# Patient Record
Sex: Male | Born: 1955
Health system: Southern US, Community
[De-identification: ages and names within clinical notes are randomized; demographics above are authoritative.]

## PROBLEM LIST (undated history)

## (undated) DIAGNOSIS — K8020 Calculus of gallbladder without cholecystitis without obstruction: Secondary | ICD-10-CM

## (undated) DIAGNOSIS — G8929 Other chronic pain: Secondary | ICD-10-CM

## (undated) DIAGNOSIS — I10 Essential (primary) hypertension: Secondary | ICD-10-CM

## (undated) DIAGNOSIS — M549 Dorsalgia, unspecified: Secondary | ICD-10-CM

## (undated) HISTORY — DX: Essential (primary) hypertension: I10

## (undated) HISTORY — PX: CHOLECYSTECTOMY: SHX55

---

## 2003-03-10 ENCOUNTER — Encounter: Payer: Self-pay | Admitting: Emergency Medicine

## 2003-03-10 ENCOUNTER — Emergency Department (HOSPITAL_COMMUNITY): Admission: EM | Admit: 2003-03-10 | Discharge: 2003-03-10 | Payer: Self-pay | Admitting: Emergency Medicine

## 2008-03-05 ENCOUNTER — Emergency Department (HOSPITAL_COMMUNITY): Admission: EM | Admit: 2008-03-05 | Discharge: 2008-03-06 | Payer: Self-pay | Admitting: Emergency Medicine

## 2010-03-24 ENCOUNTER — Emergency Department (HOSPITAL_COMMUNITY): Admission: EM | Admit: 2010-03-24 | Discharge: 2010-03-24 | Payer: Self-pay | Admitting: Emergency Medicine

## 2010-03-30 ENCOUNTER — Emergency Department (HOSPITAL_COMMUNITY): Admission: EM | Admit: 2010-03-30 | Discharge: 2010-03-30 | Payer: Self-pay | Admitting: Emergency Medicine
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
## 2012-10-22 ENCOUNTER — Emergency Department (HOSPITAL_COMMUNITY)
Admission: EM | Admit: 2012-10-22 | Discharge: 2012-10-22 | Disposition: A | Discharge status: Home / Self Care | Payer: Self-pay | Attending: Emergency Medicine | Admitting: Emergency Medicine

## 2012-10-22 ENCOUNTER — Encounter (HOSPITAL_COMMUNITY): Payer: Self-pay | Admitting: *Deleted

## 2012-10-22 DIAGNOSIS — M549 Dorsalgia, unspecified: Secondary | ICD-10-CM | POA: Insufficient documentation

## 2012-10-22 DIAGNOSIS — G8929 Other chronic pain: Secondary | ICD-10-CM | POA: Insufficient documentation

## 2012-10-22 DIAGNOSIS — R1013 Epigastric pain: Secondary | ICD-10-CM | POA: Insufficient documentation

## 2012-10-22 DIAGNOSIS — F17200 Nicotine dependence, unspecified, uncomplicated: Secondary | ICD-10-CM | POA: Insufficient documentation

## 2012-10-22 HISTORY — DX: Dorsalgia, unspecified: M54.9

## 2012-10-22 HISTORY — DX: Other chronic pain: G89.29

## 2012-10-22 LAB — COMPREHENSIVE METABOLIC PANEL
Albumin: 4 g/dL (ref 3.5–5.2)
Alkaline Phosphatase: 54 U/L (ref 39–117)
BUN: 20 mg/dL (ref 6–23)
CO2: 29 mEq/L (ref 19–32)
Chloride: 101 mEq/L (ref 96–112)
Creatinine, Ser: 1.13 mg/dL (ref 0.50–1.35)
GFR calc Af Amer: 83 mL/min — ABNORMAL LOW (ref >90)
GFR calc non Af Amer: 71 mL/min — ABNORMAL LOW (ref >90)
Glucose, Bld: 121 mg/dL — ABNORMAL HIGH (ref 70–99)
Total Bilirubin: 0.5 mg/dL (ref 0.3–1.2)

## 2012-10-22 LAB — HEPATIC FUNCTION PANEL
Alkaline Phosphatase: 50 U/L (ref 39–117)
Bilirubin, Direct: 0.2 mg/dL (ref 0.0–0.3)
Indirect Bilirubin: 0.3 mg/dL (ref 0.3–0.9)
Total Bilirubin: 0.5 mg/dL (ref 0.3–1.2)

## 2012-10-22 LAB — CBC WITH DIFFERENTIAL/PLATELET
Basophils Relative: 0 % (ref 0–1)
HCT: 40.6 % (ref 39.0–52.0)
Hemoglobin: 13.9 g/dL (ref 13.0–17.0)
Lymphs Abs: 1.2 10*3/uL (ref 0.7–4.0)
MCHC: 34.2 g/dL (ref 30.0–36.0)
Monocytes Absolute: 0.3 10*3/uL (ref 0.1–1.0)
Monocytes Relative: 5 % (ref 3–12)
Neutro Abs: 5.1 10*3/uL (ref 1.7–7.7)
RBC: 5.12 MIL/uL (ref 4.22–5.81)

## 2012-10-22 LAB — LIPASE, BLOOD: Lipase: 26 U/L (ref 11–59)

## 2012-10-22 LAB — POCT I-STAT TROPONIN I: Troponin i, poc: 0.02 ng/mL (ref 0.00–0.08)

## 2012-10-22 MED ORDER — HYDROCODONE-ACETAMINOPHEN 5-325 MG PO TABS: 1.0000 | ORAL_TABLET | ORAL | Status: DC | PRN

## 2012-10-22 MED ORDER — GI COCKTAIL ~~LOC~~
30.0000 mL | Freq: Once | ORAL | Status: AC
  Administered 2012-10-22: 30 mL via ORAL
  Filled 2012-10-22: qty 30

## 2012-10-22 MED ORDER — OMEPRAZOLE 20 MG PO CPDR: 20.0000 mg | DELAYED_RELEASE_CAPSULE | Freq: Every day | ORAL | Status: DC

## 2012-10-22 NOTE — ED Notes (Signed)
NAD noted at time of d/c home 

## 2012-10-22 NOTE — ED Notes (Signed)
Pt experienced acute onset epigstric pain about 2 hours after eating breakfast.  Denies hx of GERD.  Denies urinary or bowel s/s.  C/o weakness, some nausea and some sob.

## 2012-10-22 NOTE — ED Notes (Signed)
Pt reports drinking daily and last drink was a 40oz beer. Pt c/o indigestion after greasy types of food.

## 2012-10-22 NOTE — ED Provider Notes (Signed)
History     CSN: [id number]  Arrival date & time 10/22/12  1122   First MD Initiated Contact with Patient 10/22/12 1150      Chief Complaint  Patient presents with  . Abdominal Pain    (Consider location/radiation/quality/duration/timing/severity/associated sxs/prior treatment) Patient is a 57 y.o. male presenting with abdominal pain. The history is provided by the patient.  Abdominal Pain Pain location:  Epigastric Pain quality: sharp   Pain radiates to:  Back Pain severity:  Moderate Onset quality:  Gradual Duration:  1 day Context comment:  He denies previous or repeated history of similar symptoms. He reports one beer last night and denies regular, daily alcohol use. Associated symptoms: no chest pain, no dysuria, no fever, no nausea, no shortness of breath and no vomiting   Associated symptoms comment:  He presents for evaluation of epigastric abdominal pain since this morning around 10:30. He has continued to have a normal appetite today. No fever, nausea or vomiting. No history of abdominal surgeries, known gastric or peptic ulcers. He denies cough or chest pain.   Past Medical History  Diagnosis Date  . Chronic back pain     History reviewed. No pertinent past surgical history.  No family history on file.  History  Substance Use Topics  . Smoking status: Current Every Day Smoker -- 1.00 packs/day    Types: Cigarettes  . Smokeless tobacco: Not on file  . Alcohol Use: No      Review of Systems  Constitutional: Negative for fever.  Respiratory: Negative for shortness of breath.   Cardiovascular: Negative for chest pain.  Gastrointestinal: Positive for abdominal pain. Negative for nausea and vomiting.  Genitourinary: Negative for dysuria.  Musculoskeletal: Negative for myalgias.  Neurological: Negative for light-headedness.  Psychiatric/Behavioral: Negative for confusion.    Allergies  Review of patient's allergies indicates no known allergies.  Home  Medications  No current outpatient prescriptions on file.  BP 120/52  Pulse 60  Temp(Src) 94.4 F (34.7 C) (Oral)  Resp 18  Ht 5' 9.5" (1.765 m)  Wt 185 lb (83.915 kg)  BMI 26.94 kg/m2  SpO2 98%  Physical Exam  Constitutional: He is oriented to person, place, and time. He appears well-developed and well-nourished.  HENT:  Head: Normocephalic.  Neck: Normal range of motion. Neck supple.  Cardiovascular: Normal rate and regular rhythm.   Pulmonary/Chest: Effort normal and breath sounds normal.  Abdominal: Soft. Bowel sounds are normal. There is no rebound and no guarding.  Eigastric tenderness to palpation. No guarding. No RUQ or LUQ tenderness.   Musculoskeletal: Normal range of motion.  Neurological: He is alert and oriented to person, place, and time.  Skin: Skin is warm and dry. No rash noted.  Psychiatric: He has a normal mood and affect.    ED Course  Procedures (including critical care time)  Labs Reviewed  COMPREHENSIVE METABOLIC PANEL - Abnormal; Notable for the following:    Glucose, Bld 121 (*)    GFR calc non Af Amer 71 (*)    GFR calc Af Amer 83 (*)    All other components within normal limits  CBC WITH DIFFERENTIAL  LIPASE, BLOOD  HEPATIC FUNCTION PANEL  POCT I-STAT TROPONIN I   Results for orders placed during the hospital encounter of 10/22/12  CBC WITH DIFFERENTIAL      Result Value Range   WBC 6.6  4.0 - 10.5 K/uL   RBC 5.12  4.22 - 5.81 MIL/uL   Hemoglobin 13.9  13.0 -  17.0 g/dL   HCT 16.1  09.6 - 04.5 %   MCV 79.3  78.0 - 100.0 fL   MCH 27.1  26.0 - 34.0 pg   MCHC 34.2  30.0 - 36.0 g/dL   RDW 40.9  81.1 - 91.4 %   Platelets 244  150 - 400 K/uL   Neutrophils Relative 76  43 - 77 %   Neutro Abs 5.1  1.7 - 7.7 K/uL   Lymphocytes Relative 18  12 - 46 %   Lymphs Abs 1.2  0.7 - 4.0 K/uL   Monocytes Relative 5  3 - 12 %   Monocytes Absolute 0.3  0.1 - 1.0 K/uL   Eosinophils Relative 1  0 - 5 %   Eosinophils Absolute 0.1  0.0 - 0.7 K/uL    Basophils Relative 0  0 - 1 %   Basophils Absolute 0.0  0.0 - 0.1 K/uL  COMPREHENSIVE METABOLIC PANEL      Result Value Range   Sodium 138  135 - 145 mEq/L   Potassium 4.0  3.5 - 5.1 mEq/L   Chloride 101  96 - 112 mEq/L   CO2 29  19 - 32 mEq/L   Glucose, Bld 121 (*) 70 - 99 mg/dL   BUN 20  6 - 23 mg/dL   Creatinine, Ser 7.82  0.50 - 1.35 mg/dL   Calcium 9.8  8.4 - 95.6 mg/dL   Total Protein 7.7  6.0 - 8.3 g/dL   Albumin 4.0  3.5 - 5.2 g/dL   AST 27  0 - 37 U/L   ALT 13  0 - 53 U/L   Alkaline Phosphatase 54  39 - 117 U/L   Total Bilirubin 0.5  0.3 - 1.2 mg/dL   GFR calc non Af Amer 71 (*) >90 mL/min   GFR calc Af Amer 83 (*) >90 mL/min  LIPASE, BLOOD      Result Value Range   Lipase 26  11 - 59 U/L  HEPATIC FUNCTION PANEL      Result Value Range   Total Protein 7.7  6.0 - 8.3 g/dL   Albumin 3.9  3.5 - 5.2 g/dL   AST 27  0 - 37 U/L   ALT 13  0 - 53 U/L   Alkaline Phosphatase 50  39 - 117 U/L   Total Bilirubin 0.5  0.3 - 1.2 mg/dL   Bilirubin, Direct 0.2  0.0 - 0.3 mg/dL   Indirect Bilirubin 0.3  0.3 - 0.9 mg/dL  POCT I-STAT TROPONIN I      Result Value Range   Troponin i, poc 0.02  0.00 - 0.08 ng/mL   Comment 3            Date: 10/22/2012  Rate: 53  Rhythm: normal sinus rhythm  QRS Axis: normal  Intervals: normal  ST/T Wave abnormalities: normal  Conduction Disutrbances:none  Narrative Interpretation:   Old EKG Reviewed: none available    No results found.   No diagnosis found. 1. Epigastric pain    MDM  Re-evaluation:  Pain is unchanged after GI cocktail. He remains mildly tender to palpation of epigastrium. Blood studies are normal. Vital signs stable - initially hypothermic for now normal temp on recheck. Feel he is stable for discharge and GI follow up in the outpatient setting.        Arnoldo Hooker, PA-C 10/22/12 1458

## 2012-10-24 NOTE — ED Provider Notes (Signed)
Medical screening examination/treatment/procedure(s) were performed by non-physician practitioner and as supervising physician I was immediately available for consultation/collaboration.  Christoper Bushey T Tiesha Marich, MD 10/24/12 0821 

## 2012-12-25 ENCOUNTER — Ambulatory Visit (HOSPITAL_COMMUNITY)
Admission: RE | Admit: 2012-12-25 | Discharge: 2012-12-25 | Disposition: A | Discharge status: Home / Self Care | Payer: Self-pay | Source: Ambulatory Visit | Attending: Pediatrics | Admitting: Pediatrics

## 2012-12-25 ENCOUNTER — Other Ambulatory Visit (HOSPITAL_COMMUNITY): Payer: Self-pay | Admitting: *Deleted

## 2012-12-25 DIAGNOSIS — M545 Low back pain: Secondary | ICD-10-CM

## 2012-12-25 DIAGNOSIS — M79609 Pain in unspecified limb: Secondary | ICD-10-CM | POA: Insufficient documentation

## 2012-12-25 DIAGNOSIS — R209 Unspecified disturbances of skin sensation: Secondary | ICD-10-CM | POA: Insufficient documentation

## 2014-02-10 ENCOUNTER — Encounter (HOSPITAL_COMMUNITY): Payer: Self-pay | Admitting: Emergency Medicine

## 2014-02-10 ENCOUNTER — Emergency Department (HOSPITAL_COMMUNITY): Payer: Self-pay

## 2014-02-10 ENCOUNTER — Emergency Department (HOSPITAL_COMMUNITY)
Admission: EM | Admit: 2014-02-10 | Discharge: 2014-02-10 | Disposition: A | Discharge status: Home / Self Care | Payer: Self-pay | Attending: Emergency Medicine | Admitting: Emergency Medicine

## 2014-02-10 DIAGNOSIS — F17200 Nicotine dependence, unspecified, uncomplicated: Secondary | ICD-10-CM | POA: Insufficient documentation

## 2014-02-10 DIAGNOSIS — G8929 Other chronic pain: Secondary | ICD-10-CM | POA: Insufficient documentation

## 2014-02-10 DIAGNOSIS — R059 Cough, unspecified: Secondary | ICD-10-CM | POA: Insufficient documentation

## 2014-02-10 DIAGNOSIS — Z79899 Other long term (current) drug therapy: Secondary | ICD-10-CM | POA: Insufficient documentation

## 2014-02-10 DIAGNOSIS — R05 Cough: Secondary | ICD-10-CM

## 2014-02-10 MED ORDER — AZITHROMYCIN 250 MG PO TABS
250.0000 mg | ORAL_TABLET | Freq: Every day | ORAL | Status: DC
Start: 2014-02-10 — End: 2014-12-13

## 2014-02-10 MED ORDER — DEXTROMETHORPHAN POLISTIREX 30 MG/5ML PO LQCR: 30.0000 mg | ORAL | Status: DC | PRN

## 2014-02-10 NOTE — ED Provider Notes (Signed)
Medical screening examination/treatment/procedure(s) were performed by non-physician practitioner and as supervising physician I was immediately available for consultation/collaboration.   Jaice Lague David Osvaldo Lamping III, MD 02/10/14 2203 

## 2014-02-10 NOTE — ED Notes (Signed)
Case management at bedside.

## 2014-02-10 NOTE — Discharge Instructions (Signed)
Take Azithromycin as directed until gone. Take delsym as needed for cough. Refer to attached documents for more information.

## 2014-02-10 NOTE — Progress Notes (Signed)
  CARE MANAGEMENT ED NOTE 02/10/2014  Patient:  Ivan Guerrero,Ivan Guerrero   Account Number:  [id number]  Date Initiated:  02/10/2014  Documentation initiated by:  Ivan Guerrero,Ivan Guerrero  Subjective/Objective Assessment:   58 yo male presenting to the ED with a cough     Subjective/Objective Assessment Detail:     Action/Plan:   Patient is to follow up with PCP resources   Action/Plan Detail:   Anticipated DC Date:       Status Recommendation to Physician:   Result of Recommendation:  Agreed    DC Planning Services  CM consult  Other  PCP issues    Choice offered to / List presented to:  C-1 Patient          Status of service:  Completed, signed off  ED Comments:   ED Comments Detail:  CM consulted to assist with PCP. This CM spoke with the patient regarding his ED visit. The patient stated that he has had a persistent cough for several weeks. This CM provided the patient with a list of [facility] resources and the pamphlet for the [facility] and encouraged him to follow up. This CM provided the information to the [facility] liaison to follow up for the orange card.

## 2014-02-10 NOTE — ED Provider Notes (Signed)
CSN: [id number]     Arrival date & time 02/10/14  1144 History  This chart was scribed for Ivan BeckKaitlyn Deonta Bomberger, PA-C, working with Ivan RoofJohn David Wofford III, MD, by Ivan CarryG. Clay Guerrero, ED Scribe. The patient was seen in TR05C/TR05C. The patient's care was started at 1:24 PM.    Chief Complaint  Patient presents with  . Cough   HPI HPI Comments: Ivan Guerrero is a 58 y.o. male with a history of smoking who presents to the Emergency Department complaining of a productive cough beginning 5-6 months ago. Patient reports associated weight loss of an estimated 10-12 pounds. Patient reports that he moved to a new house in December 2014. He believes there may be mold in the house, and thinks this may be associated with his cough. Patient denies fever or hemoptysis.   Patient does not have a PCP.   Past Medical History  Diagnosis Date  . Chronic back pain    History reviewed. No pertinent past surgical history. History reviewed. No pertinent family history. History  Substance Use Topics  . Smoking status: Current Every Day Smoker -- 1.00 packs/day    Types: Cigarettes  . Smokeless tobacco: Not on file  . Alcohol Use: No    Review of Systems  Constitutional: Positive for unexpected weight change. Negative for fever.  Respiratory: Positive for cough.        Denies coughing up blood.  All other systems reviewed and are negative.     Allergies  Review of patient's allergies indicates no known allergies.  Home Medications   Prior to Admission medications   Medication Sig Start Date End Date Taking? Authorizing Provider  HYDROcodone-acetaminophen (NORCO/VICODIN) 5-325 MG per tablet Take 1 tablet by mouth every 4 (four) hours as needed for pain. 10/22/12   Shari A Upstill, PA-C  omeprazole (PRILOSEC) 20 MG capsule Take 1 capsule (20 mg total) by mouth daily. 10/22/12   Shari A Upstill, PA-C   Triage Vitals: BP 147/87  Pulse 80  Temp(Src) 98.4 F (36.9 C) (Oral)  Resp 18  SpO2 99% Physical Exam   Nursing note and vitals reviewed. Constitutional: He is oriented to person, place, and time. He appears well-developed and well-nourished. No distress.  HENT:  Head: Normocephalic and atraumatic.  Mouth/Throat: Oropharynx is clear and moist. No oropharyngeal exudate.  Eyes: Conjunctivae and EOM are normal.  Neck: Normal range of motion.  Cardiovascular: Normal rate and regular rhythm.  Exam reveals no gallop and no friction rub.   No murmur heard. Pulmonary/Chest: Effort normal and breath sounds normal. He has no wheezes. He has no rales. He exhibits no tenderness.  Musculoskeletal: Normal range of motion.  Lymphadenopathy:    He has no cervical adenopathy.  Neurological: He is alert and oriented to person, place, and time. Coordination normal.  Speech is goal-oriented. Moves limbs without ataxia.   Skin: Skin is warm and dry.  Psychiatric: He has a normal mood and affect. His behavior is normal.    ED Course  Procedures (including critical care time) DIAGNOSTIC STUDIES: Oxygen Saturation is 99% on room air, normal by my interpretation.    COORDINATION OF CARE: 1:30 PM-Discussed treatment plan which includes a CXR with pt at bedside and pt agreed to plan.     Labs Review Labs Reviewed - No data to display  Imaging Review Dg Chest 2 View  02/10/2014   CLINICAL DATA:  Cough  EXAM: CHEST  2 VIEW  COMPARISON:  None.  FINDINGS: Normal heart size. Hyperaeration. No  consolidation or mass. No pneumothorax. No pleural effusion.  IMPRESSION: No active cardiopulmonary disease.   Electronically Signed   By: Ivan Guerrero M.D.   On: 02/10/2014 13:11     EKG Interpretation None      MDM   Final diagnoses:  Cough    Patient's chest xray unremarkable for acute changes. Patient likely has bronchitis. He believes his symptoms are due to mold in his house. Patient referred to PCP and seen by case management. Patient will have azithromycin and delsym for symptoms.   I personally performed  the services described in this documentation, which was scribed in my presence. The recorded information has been reviewed and is accurate.    Ivan Beck, PA-C 02/10/14 1600

## 2014-02-10 NOTE — ED Notes (Signed)
Per pt sts he has had a cough for a while. sts he thinks its mold in his house.

## 2014-02-19 ENCOUNTER — Encounter: Payer: Self-pay | Admitting: Internal Medicine

## 2014-02-19 ENCOUNTER — Ambulatory Visit: Payer: Self-pay | Attending: Internal Medicine | Admitting: Internal Medicine

## 2014-02-19 VITALS — BP 148/89 | HR 55 | Temp 97.6°F | Resp 14 | Ht 69.5 in | Wt 152.0 lb

## 2014-02-19 DIAGNOSIS — R059 Cough, unspecified: Secondary | ICD-10-CM

## 2014-02-19 DIAGNOSIS — F17200 Nicotine dependence, unspecified, uncomplicated: Secondary | ICD-10-CM

## 2014-02-19 DIAGNOSIS — R0981 Nasal congestion: Secondary | ICD-10-CM

## 2014-02-19 DIAGNOSIS — Z139 Encounter for screening, unspecified: Secondary | ICD-10-CM

## 2014-02-19 DIAGNOSIS — H538 Other visual disturbances: Secondary | ICD-10-CM

## 2014-02-19 DIAGNOSIS — R030 Elevated blood-pressure reading, without diagnosis of hypertension: Secondary | ICD-10-CM

## 2014-02-19 DIAGNOSIS — IMO0001 Reserved for inherently not codable concepts without codable children: Secondary | ICD-10-CM

## 2014-02-19 DIAGNOSIS — R05 Cough: Secondary | ICD-10-CM

## 2014-02-19 DIAGNOSIS — Z1211 Encounter for screening for malignant neoplasm of colon: Secondary | ICD-10-CM

## 2014-02-19 DIAGNOSIS — J3489 Other specified disorders of nose and nasal sinuses: Secondary | ICD-10-CM

## 2014-02-19 LAB — LIPID PANEL
Cholesterol: 141 mg/dL (ref 0–200)
HDL: 50 mg/dL (ref >39)
LDL Cholesterol: 81 mg/dL (ref 0–99)
Total CHOL/HDL Ratio: 2.8 Ratio
Triglycerides: 48 mg/dL (ref <150)
VLDL: 10 mg/dL (ref 0–40)

## 2014-02-19 LAB — CBC WITH DIFFERENTIAL/PLATELET
BASOS PCT: 1 % (ref 0–1)
Basophils Absolute: 0 10*3/uL (ref 0.0–0.1)
EOS PCT: 2 % (ref 0–5)
Eosinophils Absolute: 0.1 10*3/uL (ref 0.0–0.7)
HEMATOCRIT: 41.9 % (ref 39.0–52.0)
HEMOGLOBIN: 13.7 g/dL (ref 13.0–17.0)
Lymphocytes Relative: 44 % (ref 12–46)
Lymphs Abs: 1.4 10*3/uL (ref 0.7–4.0)
MCH: 27.3 pg (ref 26.0–34.0)
MCHC: 32.7 g/dL (ref 30.0–36.0)
MCV: 83.5 fL (ref 78.0–100.0)
MONO ABS: 0.5 10*3/uL (ref 0.1–1.0)
MONOS PCT: 15 % — AB (ref 3–12)
NEUTROS ABS: 1.2 10*3/uL — AB (ref 1.7–7.7)
Neutrophils Relative %: 38 % — ABNORMAL LOW (ref 43–77)
Platelets: 285 10*3/uL (ref 150–400)
RBC: 5.02 MIL/uL (ref 4.22–5.81)
RDW: 15.9 % — ABNORMAL HIGH (ref 11.5–15.5)
WBC: 3.2 10*3/uL — ABNORMAL LOW (ref 4.0–10.5)

## 2014-02-19 LAB — COMPLETE METABOLIC PANEL WITH GFR
ALBUMIN: 4 g/dL (ref 3.5–5.2)
ALK PHOS: 39 U/L (ref 39–117)
ALT: <8 U/L (ref 0–53)
AST: 12 U/L (ref 0–37)
BUN: 16 mg/dL (ref 6–23)
CALCIUM: 9 mg/dL (ref 8.4–10.5)
CO2: 29 mEq/L (ref 19–32)
Chloride: 104 mEq/L (ref 96–112)
Creat: 1 mg/dL (ref 0.50–1.35)
GFR, EST NON AFRICAN AMERICAN: 84 mL/min
GFR, Est African American: >89 mL/min
GLUCOSE: 93 mg/dL (ref 70–99)
POTASSIUM: 5.1 meq/L (ref 3.5–5.3)
Sodium: 141 mEq/L (ref 135–145)
Total Bilirubin: 0.6 mg/dL (ref 0.2–1.2)
Total Protein: 6.8 g/dL (ref 6.0–8.3)

## 2014-02-19 LAB — TSH: TSH: 0.725 u[IU]/mL (ref 0.350–4.500)

## 2014-02-19 MED ORDER — BENZONATATE 100 MG PO CAPS: 100.0000 mg | ORAL_CAPSULE | Freq: Three times a day (TID) | ORAL | Status: DC | PRN

## 2014-02-19 MED ORDER — FLUTICASONE PROPIONATE 50 MCG/ACT NA SUSP: 2.0000 | Freq: Every day | NASAL | Status: AC

## 2014-02-19 NOTE — Progress Notes (Signed)
Patient Demographics  Ivan Hatchetony Guerrero, is a 58 y.o. male  XBM:[id number]:[id number]  [id number]  DOB - 08-07-1955  CC:  Chief Complaint  Patient presents with  . Establish Care  . Chemical Exposure    Possible mold exposure       HPI: Ivan Guerrero is a 58 y.o. male here today to establish medical care.Patient was recently seen in the ER and was treated for bronchitis, had a chest x-ray done which was negative for any infiltrates. Patient has completed a course of antibiotic. Patient does smoke cigarettes, I have advised patient to quit smoking has occasional cough does complain of nasal congestion postnasal drip, denies any fever chills, today's blood pressure is elevated, patient does report some history of hypertension. Patient has No headache, No chest pain, No abdominal pain - No Nausea, No new weakness tingling or numbness, No Cough - SOB.  No Known Allergies Past Medical History  Diagnosis Date  . Chronic back pain    Current Outpatient Prescriptions on File Prior to Visit  Medication Sig Dispense Refill  . azithromycin (ZITHROMAX Z-PAK) 250 MG tablet Take 1 tablet (250 mg total) by mouth daily. 500mg  PO day 1, then 250mg  PO days 205  6 tablet  0  . dextromethorphan (DELSYM) 30 MG/5ML liquid Take 5 mLs (30 mg total) by mouth as needed for cough.  89 mL  0   No current facility-administered medications on file prior to visit.   Family History  Problem Relation Age of Onset  . Hypertension Mother   . Diabetes Mother   . Hypertension Father   . Cancer Father   . Hypertension Sister   . Diabetes Brother    History   Social History  . Marital Status: Divorced    Spouse Name: N/A    Number of Children: N/A  . Years of Education: N/A   Occupational History  . Not on file.   Social History Main Topics  . Smoking status: Current Every Day Smoker -- 1.00 packs/day for 35 years    Types: Cigarettes  . Smokeless tobacco: Not on file  . Alcohol Use: No  . Drug Use: No    . Sexual Activity: Not on file   Other Topics Concern  . Not on file   Social History Narrative  . No narrative on file    Review of Systems: Constitutional: Negative for fever, chills, diaphoresis, activity change, appetite change and fatigue. HENT: Negative for ear pain, nosebleeds, congestion, facial swelling, rhinorrhea, neck pain, neck stiffness and ear discharge.  Eyes: Negative for pain, discharge, redness, itching and visual disturbance. Respiratory: Negative for cough, choking, chest tightness, shortness of breath, wheezing and stridor.  Cardiovascular: Negative for chest pain, palpitations and leg swelling. Gastrointestinal: Negative for abdominal distention. Genitourinary: Negative for dysuria, urgency, frequency, hematuria, flank pain, decreased urine volume, difficulty urinating and dyspareunia.  Musculoskeletal: Negative for back pain, joint swelling, arthralgia and gait problem. Neurological: Negative for dizziness, tremors, seizures, syncope, facial asymmetry, speech difficulty, weakness, light-headedness, numbness and headaches.  Hematological: Negative for adenopathy. Does not bruise/bleed easily. Psychiatric/Behavioral: Negative for hallucinations, behavioral problems, confusion, dysphoric mood, decreased concentration and agitation.    Objective:   Filed Vitals:   02/19/14 1127  BP: 148/89  Pulse: 55  Temp: 97.6 F (36.4 C)  Resp: 14    Physical Exam: Constitutional: Patient appears well-developed and well-nourished. No distress. HENT: Normocephalic, atraumatic, External right and left ear normal. Oropharynx is clear and moist. Nasal congestion no sinus  tenderness Eyes: Conjunctivae and EOM are normal. PERRLA, no scleral icterus. Neck: Normal ROM. Neck supple. No JVD. No tracheal deviation. No thyromegaly. CVS: RRR, S1/S2 +, no murmurs, no gallops, no carotid bruit.  Pulmonary: Effort and breath sounds normal, no stridor, rhonchi, wheezes, rales.   Abdominal: Soft. BS +, no distension, tenderness, rebound or guarding.  Musculoskeletal: Normal range of motion. No edema and no tenderness.  Neuro: Alert. Normal reflexes, muscle tone coordination. No cranial nerve deficit. Skin: Skin is warm and dry. No rash noted. Not diaphoretic. No erythema. No pallor. Psychiatric: Normal mood and affect. Behavior, judgment, thought content normal.  Lab Results  Component Value Date   WBC 6.6 10/22/2012   HGB 13.9 10/22/2012   HCT 40.6 10/22/2012   MCV 79.3 10/22/2012   PLT 244 10/22/2012   Lab Results  Component Value Date   CREATININE 1.13 10/22/2012   BUN 20 10/22/2012   NA 138 10/22/2012   K 4.0 10/22/2012   CL 101 10/22/2012   CO2 29 10/22/2012    No results found for this basename: HGBA1C   Lipid Panel  No results found for this basename: chol, trig, hdl, cholhdl, vldl, ldlcalc       Assessment and plan:   1. Elevated BP Advise patient for DASH diet  2. Smoking , Advise patient to quit smoking  3. Cough Prescribed Tessalon pearls when necessary for cough - benzonatate (TESSALON) 100 MG capsule; Take 1 capsule (100 mg total) by mouth 3 (three) times daily as needed for cough.  Dispense: 30 capsule; Refill: 1  4. Blurry vision  - Ambulatory referral to Ophthalmology  5. Screening Ordered baseline blood work - CBC with Differential - COMPLETE METABOLIC PANEL WITH GFR - TSH - Lipid panel - Vit D  25 hydroxy (rtn osteoporosis monitoring)  6. Special screening for malignant neoplasms, colon  - Ambulatory referral to Gastroenterology  7. Nasal congestion Trial of Flonase - fluticasone (FLONASE) 50 MCG/ACT nasal spray; Place 2 sprays into both nostrils daily.  Dispense: 16 g; Refill: 3      Health Maintenance -Colonoscopy: referred to GI   Return in about 3 months (around 05/22/2014).   Doris Cheadle, MD

## 2014-02-19 NOTE — Progress Notes (Signed)
Patient is here today to establish care.  His major concern is mold exposure.  The ceiling in his house fell in and had mold in it.  He would like tested today.

## 2014-02-19 NOTE — Patient Instructions (Signed)
DASH Eating Plan °DASH stands for "Dietary Approaches to Stop Hypertension." The DASH eating plan is a healthy eating plan that has been shown to reduce high blood pressure (hypertension). Additional health benefits may include reducing the risk of type 2 diabetes mellitus, heart disease, and stroke. The DASH eating plan may also help with weight loss. °WHAT DO I NEED TO KNOW ABOUT THE DASH EATING PLAN? °For the DASH eating plan, you will follow these general guidelines: °· Choose foods with a percent daily value for sodium of less than 5% (as listed on the food label). °· Use salt-free seasonings or herbs instead of table salt or sea salt. °· Check with your health care provider or pharmacist before using salt substitutes. °· Eat lower-sodium products, often labeled as "lower sodium" or "no salt added." °· Eat fresh foods. °· Eat more vegetables, fruits, and low-fat dairy products. °· Choose whole grains. Look for the word "whole" as the first word in the ingredient list. °· Choose fish and skinless chicken or turkey more often than red meat. Limit fish, poultry, and meat to 6 oz (170 g) each day. °· Limit sweets, desserts, sugars, and sugary drinks. °· Choose heart-healthy fats. °· Limit cheese to 1 oz (28 g) per day. °· Eat more home-cooked food and less restaurant, buffet, and fast food. °· Limit fried foods. °· Cook foods using methods other than frying. °· Limit canned vegetables. If you do use them, rinse them well to decrease the sodium. °· When eating at a restaurant, ask that your food be prepared with less salt, or no salt if possible. °WHAT FOODS CAN I EAT? °Seek help from a dietitian for individual calorie needs. °Grains °Whole grain or whole wheat bread. Brown rice. Whole grain or whole wheat pasta. Quinoa, bulgur, and whole grain cereals. Low-sodium cereals. Corn or whole wheat flour tortillas. Whole grain cornbread. Whole grain crackers. Low-sodium crackers. °Vegetables °Fresh or frozen vegetables  (raw, steamed, roasted, or grilled). Low-sodium or reduced-sodium tomato and vegetable juices. Low-sodium or reduced-sodium tomato sauce and paste. Low-sodium or reduced-sodium canned vegetables.  °Fruits °All fresh, canned (in natural juice), or frozen fruits. °Meat and Other Protein Products °Ground beef (85% or leaner), grass-fed beef, or beef trimmed of fat. Skinless chicken or turkey. Ground chicken or turkey. Pork trimmed of fat. All fish and seafood. Eggs. Dried beans, peas, or lentils. Unsalted nuts and seeds. Unsalted canned beans. °Dairy °Low-fat dairy products, such as skim or 1% milk, 2% or reduced-fat cheeses, low-fat ricotta or cottage cheese, or plain low-fat yogurt. Low-sodium or reduced-sodium cheeses. °Fats and Oils °Tub margarines without trans fats. Light or reduced-fat mayonnaise and salad dressings (reduced sodium). Avocado. Safflower, olive, or canola oils. Natural peanut or almond butter. °Other °Unsalted popcorn and pretzels. °The items listed above may not be a complete list of recommended foods or beverages. Contact your dietitian for more options. °WHAT FOODS ARE NOT RECOMMENDED? °Grains °White bread. White pasta. White rice. Refined cornbread. Bagels and croissants. Crackers that contain trans fat. °Vegetables °Creamed or fried vegetables. Vegetables in a cheese sauce. Regular canned vegetables. Regular canned tomato sauce and paste. Regular tomato and vegetable juices. °Fruits °Dried fruits. Canned fruit in light or heavy syrup. Fruit juice. °Meat and Other Protein Products °Fatty cuts of meat. Ribs, chicken wings, bacon, sausage, bologna, salami, chitterlings, fatback, hot dogs, bratwurst, and packaged luncheon meats. Salted nuts and seeds. Canned beans with salt. °Dairy °Whole or 2% milk, cream, half-and-half, and cream cheese. Whole-fat or sweetened yogurt. Full-fat   cheeses or blue cheese. Nondairy creamers and whipped toppings. Processed cheese, cheese spreads, or cheese  curds. °Condiments °Onion and garlic salt, seasoned salt, table salt, and sea salt. Canned and packaged gravies. Worcestershire sauce. Tartar sauce. Barbecue sauce. Teriyaki sauce. Soy sauce, including reduced sodium. Steak sauce. Fish sauce. Oyster sauce. Cocktail sauce. Horseradish. Ketchup and mustard. Meat flavorings and tenderizers. Bouillon cubes. Hot sauce. Tabasco sauce. Marinades. Taco seasonings. Relishes. °Fats and Oils °Butter, stick margarine, lard, shortening, ghee, and bacon fat. Coconut, palm kernel, or palm oils. Regular salad dressings. °Other °Pickles and olives. Salted popcorn and pretzels. °The items listed above may not be a complete list of foods and beverages to avoid. Contact your dietitian for more information. °WHERE CAN I FIND MORE INFORMATION? °[employer], [employer], [employer]: www.nhlbi.nih.gov/health/health-topics/topics/dash/ °Document Released: 06/14/2011 Document Revised: 11/09/2013 Document Reviewed: 04/29/2013 °Exit[employer]® Patient Information ©2015 [employer]. This information is not intended to replace advice given to you by your health care provider. Make sure you discuss any questions you have with your health care provider. ° °

## 2014-02-20 LAB — VITAMIN D 25 HYDROXY (VIT D DEFICIENCY, FRACTURES): VIT D 25 HYDROXY: 32 ng/mL (ref 30–89)

## 2014-03-19 ENCOUNTER — Ambulatory Visit: Payer: Self-pay | Attending: Internal Medicine

## 2014-09-12 ENCOUNTER — Encounter (HOSPITAL_COMMUNITY): Payer: Self-pay | Admitting: *Deleted

## 2014-09-12 ENCOUNTER — Emergency Department (HOSPITAL_COMMUNITY)
Admission: EM | Admit: 2014-09-12 | Discharge: 2014-09-12 | Disposition: A | Discharge status: Home / Self Care | Payer: Self-pay | Attending: Emergency Medicine | Admitting: Emergency Medicine

## 2014-09-12 DIAGNOSIS — Z720 Tobacco use: Secondary | ICD-10-CM | POA: Insufficient documentation

## 2014-09-12 DIAGNOSIS — R110 Nausea: Secondary | ICD-10-CM

## 2014-09-12 DIAGNOSIS — R1013 Epigastric pain: Secondary | ICD-10-CM | POA: Insufficient documentation

## 2014-09-12 DIAGNOSIS — R1010 Upper abdominal pain, unspecified: Secondary | ICD-10-CM

## 2014-09-12 DIAGNOSIS — R112 Nausea with vomiting, unspecified: Secondary | ICD-10-CM | POA: Insufficient documentation

## 2014-09-12 DIAGNOSIS — G8929 Other chronic pain: Secondary | ICD-10-CM | POA: Insufficient documentation

## 2014-09-12 LAB — COMPREHENSIVE METABOLIC PANEL
ALT: 13 U/L (ref 0–53)
AST: 18 U/L (ref 0–37)
Albumin: 3.5 g/dL (ref 3.5–5.2)
Alkaline Phosphatase: 53 U/L (ref 39–117)
Anion gap: 4 — ABNORMAL LOW (ref 5–15)
BUN: 13 mg/dL (ref 6–23)
CHLORIDE: 105 mmol/L (ref 96–112)
CO2: 33 mmol/L — AB (ref 19–32)
Calcium: 8.8 mg/dL (ref 8.4–10.5)
Creatinine, Ser: 1.12 mg/dL (ref 0.50–1.35)
GFR calc non Af Amer: 71 mL/min — ABNORMAL LOW (ref >90)
GFR, EST AFRICAN AMERICAN: 82 mL/min — AB (ref >90)
GLUCOSE: 79 mg/dL (ref 70–99)
POTASSIUM: 4.4 mmol/L (ref 3.5–5.1)
SODIUM: 142 mmol/L (ref 135–145)
Total Bilirubin: 0.7 mg/dL (ref 0.3–1.2)
Total Protein: 6.4 g/dL (ref 6.0–8.3)

## 2014-09-12 LAB — CBC WITH DIFFERENTIAL/PLATELET
BASOS PCT: 1 % (ref 0–1)
Basophils Absolute: 0 10*3/uL (ref 0.0–0.1)
EOS ABS: 0 10*3/uL (ref 0.0–0.7)
EOS PCT: 1 % (ref 0–5)
HCT: 40.8 % (ref 39.0–52.0)
HEMOGLOBIN: 13.4 g/dL (ref 13.0–17.0)
LYMPHS PCT: 42 % (ref 12–46)
Lymphs Abs: 1.5 10*3/uL (ref 0.7–4.0)
MCH: 27.7 pg (ref 26.0–34.0)
MCHC: 32.8 g/dL (ref 30.0–36.0)
MCV: 84.5 fL (ref 78.0–100.0)
MONO ABS: 0.3 10*3/uL (ref 0.1–1.0)
Monocytes Relative: 10 % (ref 3–12)
NEUTROS ABS: 1.6 10*3/uL — AB (ref 1.7–7.7)
Neutrophils Relative %: 46 % (ref 43–77)
PLATELETS: 237 10*3/uL (ref 150–400)
RBC: 4.83 MIL/uL (ref 4.22–5.81)
RDW: 15.5 % (ref 11.5–15.5)
WBC: 3.5 10*3/uL — ABNORMAL LOW (ref 4.0–10.5)

## 2014-09-12 LAB — LIPASE, BLOOD: Lipase: 25 U/L (ref 11–59)

## 2014-09-12 MED ORDER — PROMETHAZINE HCL 25 MG PO TABS: 25.0000 mg | ORAL_TABLET | Freq: Four times a day (QID) | ORAL | Status: DC | PRN

## 2014-09-12 MED ORDER — ONDANSETRON HCL 4 MG/2ML IJ SOLN
4.0000 mg | Freq: Once | INTRAMUSCULAR | Status: AC
  Administered 2014-09-12: 4 mg via INTRAVENOUS
  Filled 2014-09-12: qty 2

## 2014-09-12 MED ORDER — MORPHINE SULFATE 4 MG/ML IJ SOLN
4.0000 mg | Freq: Once | INTRAMUSCULAR | Status: DC
  Filled 2014-09-12: qty 1

## 2014-09-12 MED ORDER — SODIUM CHLORIDE 0.9 % IV BOLUS (SEPSIS)
1000.0000 mL | Freq: Once | INTRAVENOUS | Status: AC
  Administered 2014-09-12: 1000 mL via INTRAVENOUS

## 2014-09-12 MED ORDER — KETOROLAC TROMETHAMINE 30 MG/ML IJ SOLN
30.0000 mg | Freq: Once | INTRAMUSCULAR | Status: AC
  Administered 2014-09-12: 30 mg via INTRAVENOUS
  Filled 2014-09-12: qty 1

## 2014-09-12 MED ORDER — HYDROCODONE-ACETAMINOPHEN 5-325 MG PO TABS: 2.0000 | ORAL_TABLET | ORAL | Status: DC | PRN

## 2014-09-12 NOTE — ED Notes (Signed)
Pt reports having left side abd pain that started yesterday, radiates around to his back. Pt having vomiting, denies diarrhea or urinary symptoms.

## 2014-09-12 NOTE — ED Provider Notes (Signed)
CSN: [id number]     Arrival date & time 09/12/14  1202 History   First MD Initiated Contact with Patient 09/12/14 1334     Chief Complaint  Patient presents with  . Abdominal Pain     (Consider location/radiation/quality/duration/timing/severity/associated sxs/prior Treatment) HPI Comments: Patient is a 59 year old male with a past medical history of chronic back pain who presents with abdominal pain that started yesterday. The pain is located in the upper abdomen and radiates to his back . The pain is described as aching and severe. The pain started gradually and progressively worsened since the onset. No alleviating/aggravating factors. The patient has tried nothing for symptoms without relief. Associated symptoms include nausea and vomiting. Patient denies fever, headache, diarrhea, chest pain, SOB, dysuria, constipation. No history of abdominal surgery.    Past Medical History  Diagnosis Date  . Chronic back pain    History reviewed. No pertinent past surgical history. Family History  Problem Relation Age of Onset  . Hypertension Mother   . Diabetes Mother   . Hypertension Father   . Cancer Father   . Hypertension Sister   . Diabetes Brother    History  Substance Use Topics  . Smoking status: Current Every Day Smoker -- 1.00 packs/day for 35 years    Types: Cigarettes  . Smokeless tobacco: Not on file  . Alcohol Use: No    Review of Systems  Constitutional: Negative for fever, chills and fatigue.  HENT: Negative for trouble swallowing.   Eyes: Negative for visual disturbance.  Respiratory: Negative for shortness of breath.   Cardiovascular: Negative for chest pain and palpitations.  Gastrointestinal: Positive for vomiting and abdominal pain. Negative for nausea and diarrhea.  Genitourinary: Negative for dysuria and difficulty urinating.  Musculoskeletal: Negative for arthralgias and neck pain.  Skin: Negative for color change.  Neurological: Negative for dizziness and  weakness.  Psychiatric/Behavioral: Negative for dysphoric mood.      Allergies  Review of patient's allergies indicates no known allergies.  Home Medications   Prior to Admission medications   Medication Sig Start Date End Date Taking? Authorizing Provider  azithromycin (ZITHROMAX Z-PAK) 250 MG tablet Take 1 tablet (250 mg total) by mouth daily.  PO day 1, then  PO days 205 Patient not taking: Reported on 09/12/2014 02/10/14   Emilia Beck, PA-C  benzonatate (TESSALON) 100 MG capsule Take 1 capsule (100 mg total) by mouth 3 (three) times daily as needed for cough. Patient not taking: Reported on 09/12/2014 02/19/14   Doris Cheadle, MD  dextromethorphan (DELSYM) 30 MG/5ML liquid Take 5 mLs (30 mg total) by mouth as needed for cough. Patient not taking: Reported on 09/12/2014 02/10/14   Emilia Beck, PA-C  fluticasone [facility]) 50 MCG/ACT nasal spray Place 2 sprays into both nostrils daily. Patient not taking: Reported on 09/12/2014 02/19/14   Doris Cheadle, MD   BP 163/92 mmHg  Pulse 72  Temp(Src) 98.1 F (36.7 C) (Oral)  Resp 18  Ht 5' 9.5" (1.765 m)  Wt 160 lb (72.576 kg)  BMI 23.30 kg/m2  SpO2 100% Physical Exam  Constitutional: He is oriented to person, place, and time. He appears well-developed and well-nourished. No distress.  HENT:  Head: Normocephalic and atraumatic.  Eyes: Conjunctivae and EOM are normal.  Neck: Normal range of motion.  Cardiovascular: Normal rate and regular rhythm.  Exam reveals no gallop and no friction rub.   No murmur heard. Pulmonary/Chest: Effort normal and breath sounds normal. He has no wheezes. He  has no rales. He exhibits no tenderness.  Abdominal: Soft. He exhibits no distension. There is no tenderness. There is no rebound.  epigastric abdominal tenderness to palpation. No focal tenderness to palpation or peritoneal signs.   Musculoskeletal: Normal range of motion.  Neurological: He is alert and oriented to person, place, and time.  Coordination normal.  Speech is goal-oriented. Moves limbs without ataxia.   Skin: Skin is warm and dry.  Psychiatric: He has a normal mood and affect. His behavior is normal.  Nursing note and vitals reviewed.   ED Course  Procedures (including critical care time) Labs Review Labs Reviewed  CBC WITH DIFFERENTIAL/PLATELET - Abnormal; Notable for the following:    WBC 3.5 (*)    Neutro Abs 1.6 (*)    All other components within normal limits  COMPREHENSIVE METABOLIC PANEL - Abnormal; Notable for the following:    CO2 33 (*)    GFR calc non Af Amer 71 (*)    GFR calc Af Amer 82 (*)    Anion gap 4 (*)    All other components within normal limits  LIPASE, BLOOD  URINALYSIS, ROUTINE W REFLEX MICROSCOPIC    Imaging Review No results found.   EKG Interpretation None      MDM   Final diagnoses:  Pain of upper abdomen  Nausea   2:39 PM Labs unremarkable for acute changes. Vitals stable and patient afebrile. Patient given toradol and zofran for symptoms.   3:16 PM Patient feeling better after IV toradol and zofran and is ready for discharge. No further evaluation needed at this time. Patient instructed to follow up with PCP. Vitals stable and patient afebrile.    Emilia BeckKaitlyn Illeana Edick, PA-C 09/12/14 [address]1519  Pyper Olexa, PA-C 09/12/14 1519  Mirian MoMatthew Gentry, MD 09/15/14 2141

## 2014-09-12 NOTE — ED Notes (Signed)
Pt remains monitored by blood pressure and pulse ox.  

## 2014-09-12 NOTE — Discharge Instructions (Signed)
Take Vicodin as needed for pain. Take phenergan as needed for nausea. Refer to attached documents for more information. Return to the ED with worsening or concerning symptoms.

## 2014-10-25 ENCOUNTER — Ambulatory Visit: Payer: Self-pay | Attending: Internal Medicine | Admitting: Internal Medicine

## 2014-10-25 ENCOUNTER — Encounter: Payer: Self-pay | Admitting: Internal Medicine

## 2014-10-25 VITALS — BP 156/95 | HR 87 | Temp 98.0°F | Resp 16

## 2014-10-25 DIAGNOSIS — K4020 Bilateral inguinal hernia, without obstruction or gangrene, not specified as recurrent: Secondary | ICD-10-CM | POA: Insufficient documentation

## 2014-10-25 DIAGNOSIS — Z720 Tobacco use: Secondary | ICD-10-CM

## 2014-10-25 DIAGNOSIS — I10 Essential (primary) hypertension: Secondary | ICD-10-CM | POA: Insufficient documentation

## 2014-10-25 DIAGNOSIS — M545 Low back pain: Secondary | ICD-10-CM | POA: Insufficient documentation

## 2014-10-25 DIAGNOSIS — G8929 Other chronic pain: Secondary | ICD-10-CM | POA: Insufficient documentation

## 2014-10-25 DIAGNOSIS — Z7951 Long term (current) use of inhaled steroids: Secondary | ICD-10-CM | POA: Insufficient documentation

## 2014-10-25 DIAGNOSIS — F17200 Nicotine dependence, unspecified, uncomplicated: Secondary | ICD-10-CM

## 2014-10-25 DIAGNOSIS — R1013 Epigastric pain: Secondary | ICD-10-CM

## 2014-10-25 DIAGNOSIS — F17210 Nicotine dependence, cigarettes, uncomplicated: Secondary | ICD-10-CM | POA: Insufficient documentation

## 2014-10-25 DIAGNOSIS — Z79899 Other long term (current) drug therapy: Secondary | ICD-10-CM | POA: Insufficient documentation

## 2014-10-25 MED ORDER — GABAPENTIN 100 MG PO CAPS: 100.0000 mg | ORAL_CAPSULE | Freq: Three times a day (TID) | ORAL | Status: DC

## 2014-10-25 MED ORDER — OMEPRAZOLE 20 MG PO CPDR: 20.0000 mg | DELAYED_RELEASE_CAPSULE | Freq: Every day | ORAL | Status: DC

## 2014-10-25 MED ORDER — AMLODIPINE BESYLATE 2.5 MG PO TABS: 2.5000 mg | ORAL_TABLET | Freq: Every day | ORAL | Status: DC

## 2014-10-25 NOTE — Progress Notes (Signed)
MRN: [id number] Name: Ivan Guerrero  Sex: male Age: 59 y.o. DOB: Mar 19, 1956  Allergies: Review of patient's allergies indicates no known allergies.  Chief Complaint  Patient presents with  . Back Pain    HPI: Patient is 60 y.o. male who has history of chronic lower back pain, recently went to the emergency room with symptoms of epigastric pain, EMR reviewed patient was prescribed nausea medication as well as Percocet, also noted his blood pressure trend has been high, currently not on any medication, also smokes cigarettes, consultation to quit smoking, patient is requesting some medication for pain.patient also reported to have noticed lump in his groin on both sides for several weeks denies any fever chills.  Past Medical History  Diagnosis Date  . Chronic back pain     History reviewed. No pertinent past surgical history.    Medication List       This list is accurate as of: 10/25/14  3:31 PM.  Always use your most recent med list.               amLODipine 2.5 MG tablet  Commonly known as:  NORVASC  Take 1 tablet (2.5 mg total) by mouth daily.     azithromycin 250 MG tablet  Commonly known as:  ZITHROMAX Z-PAK  Take 1 tablet (250 mg total) by mouth daily.  PO day 1, then  PO days 205     benzonatate 100 MG capsule  Commonly known as:  TESSALON  Take 1 capsule (100 mg total) by mouth 3 (three) times daily as needed for cough.     dextromethorphan 30 MG/5ML liquid  Commonly known as:  DELSYM  Take 5 mLs (30 mg total) by mouth as needed for cough.     fluticasone 50 MCG/ACT nasal spray  Commonly known as:  FLONASE  Place 2 sprays into both nostrils daily.     gabapentin 100 MG capsule  Commonly known as:  NEURONTIN  Take 1 capsule (100 mg total) by mouth 3 (three) times daily.     HYDROcodone-acetaminophen 5-325 MG per tablet  Commonly known as:  NORCO/VICODIN  Take 2 tablets by mouth every 4 (four) hours as needed.     omeprazole 20 MG capsule    Commonly known as:  PRILOSEC  Take 1 capsule (20 mg total) by mouth daily.     promethazine 25 MG tablet  Commonly known as:  PHENERGAN  Take 1 tablet (25 mg total) by mouth every 6 (six) hours as needed for nausea or vomiting.        Meds ordered this encounter  Medications  . omeprazole (PRILOSEC) 20 MG capsule    Sig: Take 1 capsule (20 mg total) by mouth daily.    Dispense:  30 capsule    Refill:  3  . amLODipine (NORVASC) 2.5 MG tablet    Sig: Take 1 tablet (2.5 mg total) by mouth daily.    Dispense:  90 tablet    Refill:  3  . gabapentin (NEURONTIN) 100 MG capsule    Sig: Take 1 capsule (100 mg total) by mouth 3 (three) times daily.    Dispense:  90 capsule    Refill:  3     There is no immunization history on file for this patient.  Family History  Problem Relation Age of Onset  . Hypertension Mother   . Diabetes Mother   . Hypertension Father   . Cancer Father   . Hypertension Sister   .  Diabetes Brother     History  Substance Use Topics  . Smoking status: Current Every Day Smoker -- 1.00 packs/day for 35 years    Types: Cigarettes  . Smokeless tobacco: Not on file  . Alcohol Use: No    Review of Systems   As noted in HPI  Filed Vitals:   10/25/14 1442  BP: 156/95  Pulse: 87  Temp: 98 F (36.7 C)  Resp: 16    Physical Exam  Physical Exam  Eyes: EOM are normal. Pupils are equal, round, and reactive to light.  Cardiovascular: Normal rate and regular rhythm.   Pulmonary/Chest: Breath sounds normal. No respiratory distress. He has no wheezes. He has no rales.  Abdominal:  Minimal epigastric tenderness, rebound or guarding. Bilateral groin lump nontender, reducible, increases with coughing.  Musculoskeletal: He exhibits no edema.    CBC    Component Value Date/Time   WBC 3.5* 09/12/2014 1221   RBC 4.83 09/12/2014 1221   HGB 13.4 09/12/2014 1221   HCT 40.8 09/12/2014 1221   PLT 237 09/12/2014 1221   MCV 84.5 09/12/2014 1221    LYMPHSABS 1.5 09/12/2014 1221   MONOABS 0.3 09/12/2014 1221   EOSABS 0.0 09/12/2014 1221   BASOSABS 0.0 09/12/2014 1221    CMP     Component Value Date/Time   NA 142 09/12/2014 1221   K 4.4 09/12/2014 1221   CL 105 09/12/2014 1221   CO2 33* 09/12/2014 1221   GLUCOSE 79 09/12/2014 1221   BUN 13 09/12/2014 1221   CREATININE 1.12 09/12/2014 1221   CREATININE 1.00 02/19/2014 1209   CALCIUM 8.8 09/12/2014 1221   PROT 6.4 09/12/2014 1221   ALBUMIN 3.5 09/12/2014 1221   AST 18 09/12/2014 1221   ALT 13 09/12/2014 1221   ALKPHOS 53 09/12/2014 1221   BILITOT 0.7 09/12/2014 1221   GFRNONAA 71* 09/12/2014 1221   GFRNONAA 84 02/19/2014 1209   GFRAA 82* 09/12/2014 1221   GFRAA >89 02/19/2014 1209    Lab Results  Component Value Date/Time   CHOL 141 02/19/2014 12:09 PM    No results found for: HGBA1C  Lab Results  Component Value Date/Time   AST 18 09/12/2014 12:21 PM    Assessment and Plan  Essential hypertension - Plan: have started patient on amLODipine (NORVASC) 2.5 MG tablet, also advise patient for DASH diet, she'll come back in 2 weeks per nurse visit BP check   Smoking Consultation to quit smoking  Dyspepsia - Plan: lifestyle modification, trial of omeprazole (PRILOSEC) 20 MG capsule  Bilateral inguinal hernia without obstruction or gangrene, recurrence not specified - Plan: Ambulatory referral to General Surgery  Chronic lower back pain - Plan:prescribed gabapentin (NEURONTIN) 100 MG capsule    Return in about 3 months (around 01/24/2015) for hypertension, BP check in 2 weeks/Nurse Visit.   This note has been created with Education officer, environmentalDragon speech recognition software and smart phrase technology. Any transcriptional errors are unintentional.    Doris CheadleADVANI, Marisabel Macpherson, MD

## 2014-10-25 NOTE — Progress Notes (Signed)
Patient here for his chronic back pain Was recently seen in the ED and prescribed percocet Patient also complains of swelling in his groin area the past couple of months

## 2014-10-25 NOTE — Patient Instructions (Addendum)
DASH Eating Plan DASH stands for "Dietary Approaches to Stop Hypertension." The DASH eating plan is a healthy eating plan that has been shown to reduce high blood pressure (hypertension). Additional health benefits may include reducing the risk of type 2 diabetes mellitus, heart disease, and stroke. The DASH eating plan may also help with weight loss. WHAT DO I NEED TO KNOW ABOUT THE DASH EATING PLAN? For the DASH eating plan, you will follow these general guidelines:  Choose foods with a percent daily value for sodium of less than 5% (as listed on the food label).  Use salt-free seasonings or herbs instead of table salt or sea salt.  Check with your health care provider or pharmacist before using salt substitutes.  Eat lower-sodium products, often labeled as "lower sodium" or "no salt added."  Eat fresh foods.  Eat more vegetables, fruits, and low-fat dairy products.  Choose whole grains. Look for the word "whole" as the first word in the ingredient list.  Choose fish and skinless chicken or turkey more often than red meat. Limit fish, poultry, and meat to 6 oz (170 g) each day.  Limit sweets, desserts, sugars, and sugary drinks.  Choose heart-healthy fats.  Limit cheese to 1 oz (28 g) per day.  Eat more home-cooked food and less restaurant, buffet, and fast food.  Limit fried foods.  Cook foods using methods other than frying.  Limit canned vegetables. If you do use them, rinse them well to decrease the sodium.  When eating at a restaurant, ask that your food be prepared with less salt, or no salt if possible. WHAT FOODS CAN I EAT? Seek help from a dietitian for individual calorie needs. Grains Whole grain or whole wheat bread. Brown rice. Whole grain or whole wheat pasta. Quinoa, bulgur, and whole grain cereals. Low-sodium cereals. Corn or whole wheat flour tortillas. Whole grain cornbread. Whole grain crackers. Low-sodium crackers. Vegetables Fresh or frozen vegetables  (raw, steamed, roasted, or grilled). Low-sodium or reduced-sodium tomato and vegetable juices. Low-sodium or reduced-sodium tomato sauce and paste. Low-sodium or reduced-sodium canned vegetables.  Fruits All fresh, canned (in natural juice), or frozen fruits. Meat and Other Protein Products Ground beef (85% or leaner), grass-fed beef, or beef trimmed of fat. Skinless chicken or turkey. Ground chicken or turkey. Pork trimmed of fat. All fish and seafood. Eggs. Dried beans, peas, or lentils. Unsalted nuts and seeds. Unsalted canned beans. Dairy Low-fat dairy products, such as skim or 1% milk, 2% or reduced-fat cheeses, low-fat ricotta or cottage cheese, or plain low-fat yogurt. Low-sodium or reduced-sodium cheeses. Fats and Oils Tub margarines without trans fats. Light or reduced-fat mayonnaise and salad dressings (reduced sodium). Avocado. Safflower, olive, or canola oils. Natural peanut or almond butter. Other Unsalted popcorn and pretzels. The items listed above may not be a complete list of recommended foods or beverages. Contact your dietitian for more options. WHAT FOODS ARE NOT RECOMMENDED? Grains White bread. White pasta. White rice. Refined cornbread. Bagels and croissants. Crackers that contain trans fat. Vegetables Creamed or fried vegetables. Vegetables in a cheese sauce. Regular canned vegetables. Regular canned tomato sauce and paste. Regular tomato and vegetable juices. Fruits Dried fruits. Canned fruit in light or heavy syrup. Fruit juice. Meat and Other Protein Products Fatty cuts of meat. Ribs, chicken wings, bacon, sausage, bologna, salami, chitterlings, fatback, hot dogs, bratwurst, and packaged luncheon meats. Salted nuts and seeds. Canned beans with salt. Dairy Whole or 2% milk, cream, half-and-half, and cream cheese. Whole-fat or sweetened yogurt. Full-fat   cheeses or blue cheese. Nondairy creamers and whipped toppings. Processed cheese, cheese spreads, or cheese  curds. Condiments Onion and garlic salt, seasoned salt, table salt, and sea salt. Canned and packaged gravies. Worcestershire sauce. Tartar sauce. Barbecue sauce. Teriyaki sauce. Soy sauce, including reduced sodium. Steak sauce. Fish sauce. Oyster sauce. Cocktail sauce. Horseradish. Ketchup and mustard. Meat flavorings and tenderizers. Bouillon cubes. Hot sauce. Tabasco sauce. Marinades. Taco seasonings. Relishes. Fats and Oils Butter, stick margarine, lard, shortening, ghee, and bacon fat. Coconut, palm kernel, or palm oils. Regular salad dressings. Other Pickles and olives. Salted popcorn and pretzels. The items listed above may not be a complete list of foods and beverages to avoid. Contact your dietitian for more information. WHERE CAN I FIND MORE INFORMATION? [employer], [employer]: www.nhlbi.nih.gov/health/health-topics/topics/dash/ Document Released: 06/14/2011 Document Revised: 11/09/2013 Document Reviewed: 04/29/2013 [employer] Patient Information 2015 [employer]. This information is not intended to replace advice given to you by your health care provider. Make sure you discuss any questions you have with your health care provider. Smoking Cessation Quitting smoking is important to your health and has many advantages. However, it is not always easy to quit since nicotine is a very addictive drug. Oftentimes, people try 3 times or more before being able to quit. This document explains the best ways for you to prepare to quit smoking. Quitting takes hard work and a lot of effort, but you can do it. ADVANTAGES OF QUITTING SMOKING  You will live longer, feel better, and live better.  Your body will feel the impact of quitting smoking almost immediately.  Within 20 minutes, blood pressure decreases. Your pulse returns to its normal level.  After 8 hours, carbon monoxide levels in the blood return to normal. Your oxygen level increases.  After 24 hours, the chance of  having a heart attack starts to decrease. Your breath, hair, and body stop smelling like smoke.  After 48 hours, damaged nerve endings begin to recover. Your sense of taste and smell improve.  After 72 hours, the body is virtually free of nicotine. Your bronchial tubes relax and breathing becomes easier.  After 2 to 12 weeks, lungs can hold more air. Exercise becomes easier and circulation improves.  The risk of having a heart attack, stroke, cancer, or lung disease is greatly reduced.  After 1 year, the risk of coronary heart disease is cut in half.  After 5 years, the risk of stroke falls to the same as a nonsmoker.  After 10 years, the risk of lung cancer is cut in half and the risk of other cancers decreases significantly.  After 15 years, the risk of coronary heart disease drops, usually to the level of a nonsmoker.  If you are pregnant, quitting smoking will improve your chances of having a healthy baby.  The people you live with, especially any children, will be healthier.  You will have extra money to spend on things other than cigarettes. QUESTIONS TO THINK ABOUT BEFORE ATTEMPTING TO QUIT You may want to talk about your answers with your health care provider.  Why do you want to quit?  If you tried to quit in the past, what helped and what did not?  What will be the most difficult situations for you after you quit? How will you plan to handle them?  Who can help you through the tough times? Your family? Friends? A health care provider?  What pleasures do you get from smoking? What ways can you still get pleasure   if you quit? Here are some questions to ask your health care provider:  How can you help me to be successful at quitting?  What medicine do you think would be best for me and how should I take it?  What should I do if I need more help?  What is smoking withdrawal like? How can I get information on withdrawal? GET READY  Set a quit date.  Change your  environment by getting rid of all cigarettes, ashtrays, matches, and lighters in your home, car, or work. Do not let people smoke in your home.  Review your past attempts to quit. Think about what worked and what did not. GET SUPPORT AND ENCOURAGEMENT You have a better chance of being successful if you have help. You can get support in many ways.  Tell your family, friends, and coworkers that you are going to quit and need their support. Ask them not to smoke around you.  Get individual, group, or telephone counseling and support. Programs are available at local hospitals and health centers. Call your local health department for information about programs in your area.  Spiritual beliefs and practices may help some smokers quit.  Download a "quit meter" on your computer to keep track of quit statistics, such as how long you have gone without smoking, cigarettes not smoked, and money saved.  Get a self-help book about quitting smoking and staying off tobacco. LEARN NEW SKILLS AND BEHAVIORS  Distract yourself from urges to smoke. Talk to someone, go for a walk, or occupy your time with a task.  Change your normal routine. Take a different route to work. Drink tea instead of coffee. Eat breakfast in a different place.  Reduce your stress. Take a hot bath, exercise, or read a book.  Plan something enjoyable to do every day. Reward yourself for not smoking.  Explore interactive web-based programs that specialize in helping you quit. GET MEDICINE AND USE IT CORRECTLY Medicines can help you stop smoking and decrease the urge to smoke. Combining medicine with the above behavioral methods and support can greatly increase your chances of successfully quitting smoking.  Nicotine replacement therapy helps deliver nicotine to your body without the negative effects and risks of smoking. Nicotine replacement therapy includes nicotine gum, lozenges, inhalers, nasal sprays, and skin patches. Some may be  available over-the-counter and others require a prescription.  Antidepressant medicine helps people abstain from smoking, but how this works is unknown. This medicine is available by prescription.  Nicotinic receptor partial agonist medicine simulates the effect of nicotine in your brain. This medicine is available by prescription. Ask your health care provider for advice about which medicines to use and how to use them based on your health history. Your health care provider will tell you what side effects to look out for if you choose to be on a medicine or therapy. Carefully read the information on the package. Do not use any other product containing nicotine while using a nicotine replacement product.  RELAPSE OR DIFFICULT SITUATIONS Most relapses occur within the first 3 months after quitting. Do not be discouraged if you start smoking again. Remember, most people try several times before finally quitting. You may have symptoms of withdrawal because your body is used to nicotine. You may crave cigarettes, be irritable, feel very hungry, cough often, get headaches, or have difficulty concentrating. The withdrawal symptoms are only temporary. They are strongest when you first quit, but they will go away within 10-14 days. To reduce the   chances of relapse, try to:  Avoid drinking alcohol. Drinking lowers your chances of successfully quitting.  Reduce the amount of caffeine you consume. Once you quit smoking, the amount of caffeine in your body increases and can give you symptoms, such as a rapid heartbeat, sweating, and anxiety.  Avoid smokers because they can make you want to smoke.  Do not let weight gain distract you. Many smokers will gain weight when they quit, usually less than 10 pounds. Eat a healthy diet and stay active. You can always lose the weight gained after you quit.  Find ways to improve your mood other than smoking. FOR MORE INFORMATION  www.smokefree.gov  Document Released:  06/19/2001 Document Revised: 11/09/2013 Document Reviewed: 10/04/2011 [employer] Patient Information 2015 [employer]. This information is not intended to replace advice given to you by your health care provider. Make sure you discuss any questions you have with your health care provider.  

## 2014-11-18 ENCOUNTER — Ambulatory Visit: Payer: Self-pay | Admitting: Internal Medicine

## 2014-12-13 ENCOUNTER — Ambulatory Visit: Payer: Self-pay | Attending: Internal Medicine | Admitting: *Deleted

## 2014-12-13 VITALS — BP 146/74 | HR 64 | Temp 98.2°F | Resp 20

## 2014-12-13 DIAGNOSIS — M79605 Pain in left leg: Secondary | ICD-10-CM | POA: Insufficient documentation

## 2014-12-13 DIAGNOSIS — K4020 Bilateral inguinal hernia, without obstruction or gangrene, not specified as recurrent: Secondary | ICD-10-CM | POA: Insufficient documentation

## 2014-12-13 DIAGNOSIS — F17200 Nicotine dependence, unspecified, uncomplicated: Secondary | ICD-10-CM | POA: Insufficient documentation

## 2014-12-13 DIAGNOSIS — M549 Dorsalgia, unspecified: Secondary | ICD-10-CM | POA: Insufficient documentation

## 2014-12-13 DIAGNOSIS — M545 Low back pain: Secondary | ICD-10-CM

## 2014-12-13 DIAGNOSIS — G8929 Other chronic pain: Secondary | ICD-10-CM | POA: Insufficient documentation

## 2014-12-13 DIAGNOSIS — Z720 Tobacco use: Secondary | ICD-10-CM

## 2014-12-13 DIAGNOSIS — I10 Essential (primary) hypertension: Secondary | ICD-10-CM | POA: Insufficient documentation

## 2014-12-13 MED ORDER — BUPROPION HCL 100 MG PO TABS: 100.0000 mg | ORAL_TABLET | Freq: Two times a day (BID) | ORAL | Status: DC

## 2014-12-13 MED ORDER — GABAPENTIN 300 MG PO CAPS: 300.0000 mg | ORAL_CAPSULE | Freq: Three times a day (TID) | ORAL | Status: DC

## 2014-12-13 NOTE — Patient Instructions (Addendum)
DASH Eating Plan DASH stands for "Dietary Approaches to Stop Hypertension." The DASH eating plan is a healthy eating plan that has been shown to reduce high blood pressure (hypertension). Additional health benefits may include reducing the risk of type 2 diabetes mellitus, heart disease, and stroke. The DASH eating plan may also help with weight loss. WHAT DO I NEED TO KNOW ABOUT THE DASH EATING PLAN? For the DASH eating plan, you will follow these general guidelines:  Choose foods with a percent daily value for sodium of less than 5% (as listed on the food label).  Use salt-free seasonings or herbs instead of table salt or sea salt.  Check with your health care provider or pharmacist before using salt substitutes.  Eat lower-sodium products, often labeled as "lower sodium" or "no salt added."  Eat fresh foods.  Eat more vegetables, fruits, and low-fat dairy products.  Choose whole grains. Look for the word "whole" as the first word in the ingredient list.  Choose fish and skinless chicken or turkey more often than red meat. Limit fish, poultry, and meat to 6 oz (170 g) each day.  Limit sweets, desserts, sugars, and sugary drinks.  Choose heart-healthy fats.  Limit cheese to 1 oz (28 g) per day.  Eat more home-cooked food and less restaurant, buffet, and fast food.  Limit fried foods.  Cook foods using methods other than frying.  Limit canned vegetables. If you do use them, rinse them well to decrease the sodium.  When eating at a restaurant, ask that your food be prepared with less salt, or no salt if possible. WHAT FOODS CAN I EAT? Seek help from a dietitian for individual calorie needs. Grains Whole grain or whole wheat bread. Brown rice. Whole grain or whole wheat pasta. Quinoa, bulgur, and whole grain cereals. Low-sodium cereals. Corn or whole wheat flour tortillas. Whole grain cornbread. Whole grain crackers. Low-sodium crackers. Vegetables Fresh or frozen vegetables  (raw, steamed, roasted, or grilled). Low-sodium or reduced-sodium tomato and vegetable juices. Low-sodium or reduced-sodium tomato sauce and paste. Low-sodium or reduced-sodium canned vegetables.  Fruits All fresh, canned (in natural juice), or frozen fruits. Meat and Other Protein Products Ground beef (85% or leaner), grass-fed beef, or beef trimmed of fat. Skinless chicken or turkey. Ground chicken or turkey. Pork trimmed of fat. All fish and seafood. Eggs. Dried beans, peas, or lentils. Unsalted nuts and seeds. Unsalted canned beans. Dairy Low-fat dairy products, such as skim or 1% milk, 2% or reduced-fat cheeses, low-fat ricotta or cottage cheese, or plain low-fat yogurt. Low-sodium or reduced-sodium cheeses. Fats and Oils Tub margarines without trans fats. Light or reduced-fat mayonnaise and salad dressings (reduced sodium). Avocado. Safflower, olive, or canola oils. Natural peanut or almond butter. Other Unsalted popcorn and pretzels. The items listed above may not be a complete list of recommended foods or beverages. Contact your dietitian for more options. WHAT FOODS ARE NOT RECOMMENDED? Grains White bread. White pasta. White rice. Refined cornbread. Bagels and croissants. Crackers that contain trans fat. Vegetables Creamed or fried vegetables. Vegetables in a cheese sauce. Regular canned vegetables. Regular canned tomato sauce and paste. Regular tomato and vegetable juices. Fruits Dried fruits. Canned fruit in light or heavy syrup. Fruit juice. Meat and Other Protein Products Fatty cuts of meat. Ribs, chicken wings, bacon, sausage, bologna, salami, chitterlings, fatback, hot dogs, bratwurst, and packaged luncheon meats. Salted nuts and seeds. Canned beans with salt. Dairy Whole or 2% milk, cream, half-and-half, and cream cheese. Whole-fat or sweetened yogurt. Full-fat   cheeses or blue cheese. Nondairy creamers and whipped toppings. Processed cheese, cheese spreads, or cheese  curds. Condiments Onion and garlic salt, seasoned salt, table salt, and sea salt. Canned and packaged gravies. Worcestershire sauce. Tartar sauce. Barbecue sauce. Teriyaki sauce. Soy sauce, including reduced sodium. Steak sauce. Fish sauce. Oyster sauce. Cocktail sauce. Horseradish. Ketchup and mustard. Meat flavorings and tenderizers. Bouillon cubes. Hot sauce. Tabasco sauce. Marinades. Taco seasonings. Relishes. Fats and Oils Butter, stick margarine, lard, shortening, ghee, and bacon fat. Coconut, palm kernel, or palm oils. Regular salad dressings. Other Pickles and olives. Salted popcorn and pretzels. The items listed above may not be a complete list of foods and beverages to avoid. Contact your dietitian for more information. WHERE CAN I FIND MORE INFORMATION? National [employer], [employer], [employer]: www.nhlbi.nih.gov/health/health-topics/topics/dash/ Document Released: 06/14/2011 Document Revised: 11/09/2013 Document Reviewed: 04/29/2013 Exit[employer] Patient Information 2015 [employer]. This information is not intended to replace advice given to you by your health care provider. Make sure you discuss any questions you have with your health care provider. Smoking Cessation, Tips for Success If you are ready to quit smoking, congratulations! You have chosen to help yourself be healthier. Cigarettes bring nicotine, tar, carbon monoxide, and other irritants into your body. Your lungs, heart, and blood vessels will be able to work better without these poisons. There are many different ways to quit smoking. Nicotine gum, nicotine patches, a nicotine inhaler, or nicotine nasal spray can help with physical craving. Hypnosis, support groups, and medicines help break the habit of smoking. WHAT THINGS CAN I DO TO MAKE QUITTING EASIER?  Here are some tips to help you quit for good:  Pick a date when you will quit smoking completely. Tell all of your friends and family about your plan to quit on that  date.  Do not try to slowly cut down on the number of cigarettes you are smoking. Pick a quit date and quit smoking completely starting on that day.  Throw away all cigarettes.   Clean and remove all ashtrays from your home, work, and car.  On a card, write down your reasons for quitting. Carry the card with you and read it when you get the urge to smoke.  Cleanse your body of nicotine. Drink enough water and fluids to keep your urine clear or pale yellow. Do this after quitting to flush the nicotine from your body.  Learn to predict your moods. Do not let a bad situation be your excuse to have a cigarette. Some situations in your life might tempt you into wanting a cigarette.  Never have "just one" cigarette. It leads to wanting another and another. Remind yourself of your decision to quit.  Change habits associated with smoking. If you smoked while driving or when feeling stressed, try other activities to replace smoking. Stand up when drinking your coffee. Brush your teeth after eating. Sit in a different chair when you read the paper. Avoid alcohol while trying to quit, and try to drink fewer caffeinated beverages. Alcohol and caffeine may urge you to smoke.  Avoid foods and drinks that can trigger a desire to smoke, such as sugary or spicy foods and alcohol.  Ask people who smoke not to smoke around you.  Have something planned to do right after eating or having a cup of coffee. For example, plan to take a walk or exercise.  Try a relaxation exercise to calm you down and decrease your stress. Remember, you may be tense and nervous for the   first 2 weeks after you quit, but this will pass.  Find new activities to keep your hands busy. Play with a pen, coin, or rubber band. Doodle or draw things on paper.  Brush your teeth right after eating. This will help cut down on the craving for the taste of tobacco after meals. You can also try mouthwash.   Use oral substitutes in place of  cigarettes. Try using lemon drops, carrots, cinnamon sticks, or chewing gum. Keep them handy so they are available when you have the urge to smoke.  When you have the urge to smoke, try deep breathing.  Designate your home as a nonsmoking area.  If you are a heavy smoker, ask your health care provider about a prescription for nicotine chewing gum. It can ease your withdrawal from nicotine.  Reward yourself. Set aside the cigarette money you save and buy yourself something nice.  Look for support from others. Join a support group or smoking cessation program. Ask someone at home or at work to help you with your plan to quit smoking.  Always ask yourself, "Do I need this cigarette or is this just a reflex?" Tell yourself, "Today, I choose not to smoke," or "I do not want to smoke." You are reminding yourself of your decision to quit.  Do not replace cigarette smoking with electronic cigarettes (commonly called e-cigarettes). The safety of e-cigarettes is unknown, and some may contain harmful chemicals.  If you relapse, do not give up! Plan ahead and think about what you will do the next time you get the urge to smoke. HOW WILL I FEEL WHEN I QUIT SMOKING? You may have symptoms of withdrawal because your body is used to nicotine (the addictive substance in cigarettes). You may crave cigarettes, be irritable, feel very hungry, cough often, get headaches, or have difficulty concentrating. The withdrawal symptoms are only temporary. They are strongest when you first quit but will go away within 10-14 days. When withdrawal symptoms occur, stay in control. Think about your reasons for quitting. Remind yourself that these are signs that your body is healing and getting used to being without cigarettes. Remember that withdrawal symptoms are easier to treat than the major diseases that smoking can cause.  Even after the withdrawal is over, expect periodic urges to smoke. However, these cravings are generally  short lived and will go away whether you smoke or not. Do not smoke! WHAT RESOURCES ARE AVAILABLE TO HELP ME QUIT SMOKING? Your health care provider can direct you to community resources or hospitals for support, which may include:  Group support.  Education.  Hypnosis.  Therapy. Document Released: 03/23/2004 Document Revised: 11/09/2013 Document Reviewed: 12/11/2012 ExitCare Patient Information 2015 [employer]. This information is not intended to replace advice given to you by your health care provider. Make sure you discuss any questions you have with your health care provider. Smoking Cessation Quitting smoking is important to your health and has many advantages. However, it is not always easy to quit since nicotine is a very addictive drug. Oftentimes, people try 3 times or more before being able to quit. This document explains the best ways for you to prepare to quit smoking. Quitting takes hard work and a lot of effort, but you can do it. ADVANTAGES OF QUITTING SMOKING  You will live longer, feel better, and live better.  Your body will feel the impact of quitting smoking almost immediately.  Within 20 minutes, blood pressure decreases. Your pulse returns to its normal   level.  After 8 hours, carbon monoxide levels in the blood return to normal. Your oxygen level increases.  After 24 hours, the chance of having a heart attack starts to decrease. Your breath, hair, and body stop smelling like smoke.  After 48 hours, damaged nerve endings begin to recover. Your sense of taste and smell improve.  After 72 hours, the body is virtually free of nicotine. Your bronchial tubes relax and breathing becomes easier.  After 2 to 12 weeks, lungs can hold more air. Exercise becomes easier and circulation improves.  The risk of having a heart attack, stroke, cancer, or lung disease is greatly reduced.  After 1 year, the risk of coronary heart disease is cut in half.  After 5 years, the  risk of stroke falls to the same as a nonsmoker.  After 10 years, the risk of lung cancer is cut in half and the risk of other cancers decreases significantly.  After 15 years, the risk of coronary heart disease drops, usually to the level of a nonsmoker.  If you are pregnant, quitting smoking will improve your chances of having a healthy baby.  The people you live with, especially any children, will be healthier.  You will have extra money to spend on things other than cigarettes. QUESTIONS TO THINK ABOUT BEFORE ATTEMPTING TO QUIT You may want to talk about your answers with your health care provider.  Why do you want to quit?  If you tried to quit in the past, what helped and what did not?  What will be the most difficult situations for you after you quit? How will you plan to handle them?  Who can help you through the tough times? Your family? Friends? A health care provider?  What pleasures do you get from smoking? What ways can you still get pleasure if you quit? Here are some questions to ask your health care provider:  How can you help me to be successful at quitting?  What medicine do you think would be best for me and how should I take it?  What should I do if I need more help?  What is smoking withdrawal like? How can I get information on withdrawal? GET READY  Set a quit date.  Change your environment by getting rid of all cigarettes, ashtrays, matches, and lighters in your home, car, or work. Do not let people smoke in your home.  Review your past attempts to quit. Think about what worked and what did not. GET SUPPORT AND ENCOURAGEMENT You have a better chance of being successful if you have help. You can get support in many ways.  Tell your family, friends, and coworkers that you are going to quit and need their support. Ask them not to smoke around you.  Get individual, group, or telephone counseling and support. Programs are available at local hospitals and  health centers. Call your local health department for information about programs in your area.  Spiritual beliefs and practices may help some smokers quit.  Download a "quit meter" on your computer to keep track of quit statistics, such as how long you have gone without smoking, cigarettes not smoked, and money saved.  Get a self-help book about quitting smoking and staying off tobacco. LEARN NEW SKILLS AND BEHAVIORS  Distract yourself from urges to smoke. Talk to someone, go for a walk, or occupy your time with a task.  Change your normal routine. Take a different route to work. Drink tea instead of coffee. Eat   breakfast in a different place.  Reduce your stress. Take a hot bath, exercise, or read a book.  Plan something enjoyable to do every day. Reward yourself for not smoking.  Explore interactive web-based programs that specialize in helping you quit. GET MEDICINE AND USE IT CORRECTLY Medicines can help you stop smoking and decrease the urge to smoke. Combining medicine with the above behavioral methods and support can greatly increase your chances of successfully quitting smoking.  Nicotine replacement therapy helps deliver nicotine to your body without the negative effects and risks of smoking. Nicotine replacement therapy includes nicotine gum, lozenges, inhalers, nasal sprays, and skin patches. Some may be available over-the-counter and others require a prescription.  Antidepressant medicine helps people abstain from smoking, but how this works is unknown. This medicine is available by prescription.  Nicotinic receptor partial agonist medicine simulates the effect of nicotine in your brain. This medicine is available by prescription. Ask your health care provider for advice about which medicines to use and how to use them based on your health history. Your health care provider will tell you what side effects to look out for if you choose to be on a medicine or therapy. Carefully  read the information on the package. Do not use any other product containing nicotine while using a nicotine replacement product.  RELAPSE OR DIFFICULT SITUATIONS Most relapses occur within the first 3 months after quitting. Do not be discouraged if you start smoking again. Remember, most people try several times before finally quitting. You may have symptoms of withdrawal because your body is used to nicotine. You may crave cigarettes, be irritable, feel very hungry, cough often, get headaches, or have difficulty concentrating. The withdrawal symptoms are only temporary. They are strongest when you first quit, but they will go away within 10-14 days. To reduce the chances of relapse, try to:  Avoid drinking alcohol. Drinking lowers your chances of successfully quitting.  Reduce the amount of caffeine you consume. Once you quit smoking, the amount of caffeine in your body increases and can give you symptoms, such as a rapid heartbeat, sweating, and anxiety.  Avoid smokers because they can make you want to smoke.  Do not let weight gain distract you. Many smokers will gain weight when they quit, usually less than 10 pounds. Eat a healthy diet and stay active. You can always lose the weight gained after you quit.  Find ways to improve your mood other than smoking. FOR MORE INFORMATION  www.smokefree.gov  Document Released: 06/19/2001 Document Revised: 11/09/2013 Document Reviewed: 10/04/2011 Exit[employer] Patient Information 2015 [employer]. This information is not intended to replace advice given to you by your health care provider. Make sure you discuss any questions you have with your health care provider.  

## 2014-12-13 NOTE — Progress Notes (Signed)
Patient presents for BP check after starting amlodipine 2.5 mg daily Med list reviewed; states taking all meds as directed Patient is not adding salt to foods or cooking with salt. Patient made aware of Mrs Sharilyn SitesDash as alternative to salt. Encouraged patient to choose foods with 5% or less of daily value for sodium. Patient walking 1-2 hours per day as tolerated; states difficult due to back pain Patient denies headaches, SHOB, chest pain  Positive for blurred vision; thinks he needs corrective lenses States losing weight; decreased appetite  WT 143.8 lb today. Wt 160 09/12/2014 Currently has b/l inguinal hernias; rates pain 8/10 at present; scheduled for surgery 12/27/14 C/o chronic back and left leg pain, as well as left leg numbnes and feet numbness; rates back pain 9/10 at present Smoking 1 ppd; wants to quit  Filed Vitals:   12/13/14 0956  BP: 146/74  Pulse: 64  Temp: 98.2 F (36.8 C)  Resp: 20     Per PCP: Increase gabapentin to 300 mg tid Start wellbutrin 100 mg bid for smoking cessation  Patient to return in 4 weeks for nurse visit for BP check  Patient given literature on DASH Eating Plan, Smoking Cessation and Smoking Cessation Tips for Success. Also given 1-800-QUIT-NOW

## 2015-01-12 ENCOUNTER — Ambulatory Visit: Payer: Self-pay

## 2015-02-17 ENCOUNTER — Encounter: Payer: Self-pay | Admitting: Pharmacist

## 2015-08-05 MED FILL — ?AMLODIPINE BESYLATE 5 MG T: 5 | 30 days supply | Qty: 15 | Fill #7

## 2015-08-12 ENCOUNTER — Emergency Department (HOSPITAL_COMMUNITY)
Admission: EM | Admit: 2015-08-12 | Discharge: 2015-08-12 | Disposition: A | Discharge status: Home / Self Care | Payer: Medicaid Other | Attending: Emergency Medicine | Admitting: Emergency Medicine

## 2015-08-12 ENCOUNTER — Encounter (HOSPITAL_COMMUNITY): Payer: Self-pay | Admitting: Emergency Medicine

## 2015-08-12 DIAGNOSIS — H16002 Unspecified corneal ulcer, left eye: Secondary | ICD-10-CM | POA: Diagnosis not present

## 2015-08-12 DIAGNOSIS — H578 Other specified disorders of eye and adnexa: Secondary | ICD-10-CM | POA: Diagnosis present

## 2015-08-12 DIAGNOSIS — G8929 Other chronic pain: Secondary | ICD-10-CM | POA: Diagnosis not present

## 2015-08-12 DIAGNOSIS — Z8719 Personal history of other diseases of the digestive system: Secondary | ICD-10-CM | POA: Diagnosis not present

## 2015-08-12 DIAGNOSIS — I10 Essential (primary) hypertension: Secondary | ICD-10-CM | POA: Insufficient documentation

## 2015-08-12 DIAGNOSIS — Z79899 Other long term (current) drug therapy: Secondary | ICD-10-CM | POA: Insufficient documentation

## 2015-08-12 DIAGNOSIS — Z7951 Long term (current) use of inhaled steroids: Secondary | ICD-10-CM | POA: Insufficient documentation

## 2015-08-12 DIAGNOSIS — F17210 Nicotine dependence, cigarettes, uncomplicated: Secondary | ICD-10-CM | POA: Diagnosis not present

## 2015-08-12 HISTORY — DX: Calculus of gallbladder without cholecystitis without obstruction: K80.20

## 2015-08-12 MED ORDER — CIPROFLOXACIN HCL 0.3 % OP SOLN
2.0000 [drp] | Freq: Every day | OPHTHALMIC | Status: DC
  Administered 2015-08-12: 2 [drp] via OPHTHALMIC
  Filled 2015-08-12: qty 2.5

## 2015-08-12 MED ORDER — ERYTHROMYCIN 5 MG/GM OP OINT
TOPICAL_OINTMENT | Freq: Four times a day (QID) | OPHTHALMIC | Status: DC
  Filled 2015-08-12: qty 3.5

## 2015-08-12 MED ORDER — TETRACAINE HCL 0.5 % OP SOLN
2.0000 [drp] | Freq: Once | OPHTHALMIC | Status: AC
  Administered 2015-08-12: 2 [drp] via OPHTHALMIC
  Filled 2015-08-12: qty 4

## 2015-08-12 MED ORDER — FLUORESCEIN SODIUM 1 MG OP STRP
1.0000 | ORAL_STRIP | Freq: Once | OPHTHALMIC | Status: AC
  Administered 2015-08-12: 1 via OPHTHALMIC
  Filled 2015-08-12: qty 1

## 2015-08-12 NOTE — Discharge Instructions (Signed)
Take your medication as prescribed. Apply eye ointment to your left eye 4 times daily for 5 days. Call the ophthalmology office listed above this afternoon to schedule a follow-up appointment within 12-24 hours. Return to emergency department if symptoms worsen or new onset of fever, change in vision, worsening pain, eye drainage.   Emergency Department Resource Guide 1) Find a Doctor and Pay Out of Pocket Although you won't have to find out who is covered by your insurance plan, it is a good idea to ask around and get recommendations. You will then need to call the office and see if the doctor you have chosen will accept you as a new patient and what types of options they offer for patients who are self-pay. Some doctors offer discounts or will set up payment plans for their patients who do not have insurance, but you will need to ask so you aren't surprised when you get to your appointment.  2) Contact Your Local Health Department Not all health departments have doctors that can see patients for sick visits, but many do, so it is worth a call to see if yours does. If you don't know where your local health department is, you can check in your phone book. The CDC also has a tool to help you locate your state's health department, and many state websites also have listings of all of their local health departments.  3) Find a Walk-in Clinic If your illness is not likely to be very severe or complicated, you may want to try a walk in clinic. These are popping up all over the country in pharmacies, drugstores, and shopping centers. They're usually staffed by nurse practitioners or physician assistants that have been trained to treat common illnesses and complaints. They're usually fairly quick and inexpensive. However, if you have serious medical issues or chronic medical problems, these are probably not your best option.  No Primary Care Doctor: - Call Health Connect at  [phone] - they can help you  locate a primary care doctor that  accepts your insurance, provides certain services, etc. - Physician Referral Service- [phone]  Chronic Pain Problems: Organization         Address  Phone   Notes  [facility]  [phone] Patients need to be referred by their primary care doctor.   Medication Assistance: Organization         Address  Phone   Notes  [facility] Med[facility] [address] [place] [place], [place] [address] [phone] --Must be a resident of [facility] -- Must have NO insurance coverage whatsoever (no Medicaid/ Medicare, etc.) -- The pt. MUST have a primary care doctor that directs their care regularly and follows them in the community   [facility]  [phone]   [employer]  [phone]    Agencies that provide inexpensive medical care: Organization         Address  Phone   Notes  [facility]  [phone]   [facility]    [phone]   [facility] [address] [place], [place] [address] [phone]   [facility] [address] [place], [place] [phone]   Planned Parenthood    [phone]   [facility]    [phone]   [facility] and [facility]  [address], [place] Phone:  [phone], Fax:  [phone] Hours of  Operation:  9 am - 6 pm, M-F.  Also accepts Medicaid/Medicare and self-pay.  [facility] for [place], [address], [place] Phone: [phone], Fax: [phone]. Hours of Operation:  8:30 am - 5:30 pm, M-F.  Also accepts Medicaid and self-pay.  [facility] [address] [place] Phone: [phone]   [place], [place], [place] [phone], Ext. [phone] Mondays & Thursdays: 7-9 AM.  First 15 patients are seen on a first come, first serve basis.    [place]  Providers:  Organization         Address  Phone   Notes  [facility] [address], [place] [phone] Also accepts self-pay patients.  [facility] [address], [place]  [phone]   [place], [address], [place] [phone]   [facility] [address] [place] [phone]   Lucianne Lei [address], [place]   [phone] Only accepts [place] Access [place] patients after they have their name applied to their card.   Self-Pay (no insurance) in [facility]:  Organization         Address  Phone   Notes  Sickle Cell Patients, [facility] Internal Medicine [place] [phone]   [facility] [place] [phone]   [facility] [place]  [place], [place], [place] [phone]   [facility]/Dr. Osei-Bonsu  [address] [place] or [address], [place] [phone] Phone number for both [place] and [place] locations is the same.  [facility]gent [facility] and [facility] [address] [place] [phone]   [facility] [address] [place] or [address] [phone]   [facility] [address] [place] [phone], phone; [phone], fax Sees patients 1st and 3rd Saturday of every month.  Must not qualify for public or private insurance (i.e. Medicaid, Medicare, [place] Health Choice, Veterans' Benefits)  Household income should be no more than 200% of the poverty level The clinic cannot treat you if you are pregnant or think you are pregnant  Sexually transmitted diseases are not treated at the clinic.    Dental Care: Organization         Address  Phone  Notes  [facility] [place] [phone] Accepts children up to age 91 who are enrolled in [place] or [place]; pregnant women with a Medicaid card; and children who have applied for Medicaid or [place] Health Choice, but were declined, whose parents can pay a reduced fee at time of service.  [facility]  [address], [place] [phone] Accepts children up to age 110 who are enrolled in [place] or [place]; pregnant women with a Medicaid card; and children who have applied for Medicaid or [place] Health Choice, but were declined, whose parents can pay a reduced fee at time of service.  [facility]  [place] [phone] Patients are seen by appointment only. Walk-ins are not accepted. [place] will see patients  37 years of age and older. Monday - Tuesday (8am-5pm) Most Wednesdays (8:30-5pm) $30 per visit, cash only  [facility] Adult Dental Access PROGRAM  [address], [facility] [phone] Patients are seen by appointment only. Walk-ins are not accepted. [place] will see patients 38 years of age and older. One Wednesday Evening (Monthly: Volunteer Based).  $30 per visit, cash only  [place]  [phone] for adults; Children under age 20, call Graduate Pediatric Dentistry at [phone]. Children aged 64-14, please call [phone] to request a pediatric application.  Dental services are provided in all areas of dental care including fillings, crowns and bridges, complete and partial dentures, implants, gum treatment, root canals, and extractions. Preventive care is also provided. Treatment is provided to both adults and children. Patients are selected via a lottery and there is often a waiting list.   [facility] [address] [place]  [phone] [email or web address]   [facility]  [address] [place], [place] [phone], Ext. [phone] Second and Fourth Thursday of each month, opens at 6:30 AM; Clinic ends at 9 AM.  Patients are seen on a first-come first-served basis, and a limited number are seen during each clinic.   [facility]  [address] Hillard Danker [place], [place] [phone]   Eligibility Requirements You must have lived in [place], [place], or [place] counties for at least the last three months.   You cannot be eligible for state or federal sponsored [employer], including [employer], [place], or [employer].   You generally cannot be eligible for healthcare insurance through your employer.    How to apply: Eligibility screenings are held every Tuesday and Wednesday afternoon from 1:00 pm until 4:00 pm. You do not need an appointment for the interview!  [facility] [address] [place], [place]   [place]  [place] Department  [place]  [phone]    Behavioral Health Resources in the Community: Intensive Outpatient Programs Organization         Address  Phone  Notes  [facility] [place]. [address] [place], [place] [phone]   [facility] Outpatient [address] [place], [place]   [facility]: [facility] [address] [place], [place]   [place] [address],  [place], [place] or [phone]   Substance Abuse Resources Organization         Address  Phone  Notes  [facility] Drug Services  [phone]   [place]  [phone]   [facility] [place]   Chinita Pester  [phone]   Residential & Outpatient Substance Abuse Program  [phone]   Psychological Services Organization         Address  Phone  Notes  [facility] [place]  [place]  [phone]   [place] [address], [place] or [phone]    Mobile Crisis Teams Organization         Address  Phone  Notes  [facility], Mobile Crisis Care Unit  [phone]   [facility]  [address] [place], [place]   [facility] [address] [place] [phone]    Self-Help/Support Groups Organization  Address  Phone             Notes  [facility] [place] - variety of support groups  [phone]- [phone] Call for more information  Narcotics Anonymous (NA), [facility] [address], [employer] [place]  2 meetings at this location   Statistician         Address  Phone  Notes  [facility] [address],    [place]  [phone]   [facility]  [address], [place] [address], [place], [place] [phone]   [facility] [address] [place], [place] [phone] Admissions: 8am-3pm M-F  [facility] [address]    [place], [place] [phone]   [facility] [address] [place], [place], [place] [phone]   [facility] [address]  [place], [place] [phone]   [facility] - Intensive Outpatient [address]urell [address], [place], [place] [phone]   [facility] ([facility]iction [facility] [address] [place].,  [place], [place] [phone] or [phone]   [facility] (RTS) [address] [place], [place] [phone] Accepts Medicaid  [facility] [place] [address]  [place] [phone] Substance Abuse/Addiction Treatment   [facility] Organization         Address  Phone  Notes  [facility]  [phone]   Angie Fava, PhD [address] Ervin Knack [place], [place]   [phone] or [phone]    [facility]   [address] [place], [place] [phone]   [facility] [address], [place], [place] [phone] Insurance/Medicaid/sponsorship through [facility] and Families [address]                                    [place], [place] [phone] Therapy/tele-psych/case  [facility] [address], [place] [phone]    Dr. Lolly Mustache  [phone]   [facility]  [employer] [facility] Dept. 1) [address] [place] 2) [address], [place] 3)  [address], [place] [phone]  [phone]   [facility] Child Abuse Hotline [phone] or [phone] (After Hours)

## 2015-08-12 NOTE — ED Provider Notes (Signed)
CSN: [id number]     Arrival date & time 08/12/15  1041 History   First MD Initiated Contact with Patient 08/12/15 1054     Chief Complaint  Patient presents with  . Eye Problem    l/eye itching     (Consider location/radiation/quality/duration/timing/severity/associated sxs/prior Treatment) HPI   Patient is a 60 year old male with past medical history of hypertension who presents to the ED with complaint of left thigh pain. Patient reports he has had worsening left eye redness for the past week. Endorses associated itching, blurred vision and watering of his left eye. Denies contact use. Denies any known trauma/injury to his eye. He notes he has been using over-the-counter Visine eyedrops without relief.  Past Medical History  Diagnosis Date  . Chronic back pain   . Hypertension   . Gallstones    History reviewed. No pertinent past surgical history. Family History  Problem Relation Age of Onset  . Hypertension Mother   . Diabetes Mother   . Hypertension Father   . Cancer Father 67  . Hypertension Sister   . Diabetes Brother    Social History  Substance Use Topics  . Smoking status: Current Every Day Smoker -- 1.00 packs/day for 35 years    Types: Cigarettes  . Smokeless tobacco: None     Comment: Smoking 1 ppd  . Alcohol Use: 0.0 oz/week    0 Standard drinks or equivalent per week     Comment: occ    Review of Systems  Constitutional: Negative for fever.  HENT: Negative for congestion.   Eyes: Positive for pain, redness, itching and visual disturbance (blurred vision). Negative for photophobia and discharge.  Neurological: Negative for dizziness, weakness, light-headedness, numbness and headaches.      Allergies  Review of patient's allergies indicates no known allergies.  Home Medications   Prior to Admission medications   Medication Sig Start Date End Date Taking? Authorizing Provider  amLODipine (NORVASC) 2.5 MG tablet Take 1 tablet (2.5 mg total) by mouth  daily. 10/25/14   Doris Cheadle, MD  buPROPion (WELLBUTRIN) 100 MG tablet Take 1 tablet (100 mg total) by mouth 2 (two) times daily. 12/13/14   Doris Cheadle, MD  fluticasone (FLONASE) 50 MCG/ACT nasal spray Place 2 sprays into both nostrils daily. 02/19/14   Doris Cheadle, MD  gabapentin (NEURONTIN) 300 MG capsule Take 1 capsule (300 mg total) by mouth 3 (three) times daily. 12/13/14   Doris Cheadle, MD  HYDROcodone-acetaminophen (NORCO/VICODIN) 5-325 MG per tablet Take 2 tablets by mouth every 4 (four) hours as needed. 09/12/14   Kaitlyn Szekalski, PA-C  omeprazole (PRILOSEC) 20 MG capsule Take 1 capsule (20 mg total) by mouth daily. 10/25/14   Doris Cheadle, MD  promethazine (PHENERGAN) 25 MG tablet Take 1 tablet (25 mg total) by mouth every 6 (six) hours as needed for nausea or vomiting. Patient not taking: Reported on 12/13/2014 09/12/14   Emilia Beck, PA-C   BP 146/77 mmHg  Pulse 67  Temp(Src) 98.7 F (37.1 C) (Oral)  Resp 20  SpO2 100% Physical Exam  Constitutional: He is oriented to person, place, and time. He appears well-developed and well-nourished.  HENT:  Head: Normocephalic and atraumatic.  Mouth/Throat: Oropharynx is clear and moist. No oropharyngeal exudate.  Eyes: EOM are normal. Pupils are equal, round, and reactive to light. Right eye exhibits no chemosis, no discharge, no exudate and no hordeolum. No foreign body present in the right eye. Left eye exhibits no chemosis, no discharge, no exudate and no  hordeolum. No foreign body present in the left eye. Right conjunctiva is not injected. Left conjunctiva is injected. No scleral icterus.  Slit lamp exam:      The left eye shows corneal ulcer and fluorescein uptake. The left eye shows no corneal flare, no foreign body and no hyphema.    1mm corneal ulcer noted to medial aspect of left cornea at 9 o'clock region.  Neck: Normal range of motion. Neck supple.  Cardiovascular: Normal rate, regular rhythm, normal heart sounds and intact  distal pulses.   Pulmonary/Chest: Effort normal and breath sounds normal.  Abdominal: Soft. Bowel sounds are normal. There is no tenderness.  Musculoskeletal: He exhibits no edema.  Neurological: He is alert and oriented to person, place, and time.  Skin: Skin is warm and dry.  Nursing note and vitals reviewed.   ED Course  Procedures (including critical care time) Labs Review Labs Reviewed - No data to display  Imaging Review No results found. I have personally reviewed and evaluated these images and lab results as part of my medical decision-making.  Filed Vitals:   08/12/15 1103  BP: 146/77  Pulse: 67  Temp: 98.7 F (37.1 C)  Resp: 20     MDM   Final diagnoses:  Corneal ulcer, left    Pt with corneal ulcer on PE. No evidence of FB.  Pt reports intermittent blurred vision however no change in vision on exam.  Pt is not a contact lens wearer.  Exam non-concerning for orbital cellulitis, hyphema. Patient will be discharged home with ciprofloxacin.  Patient understands to follow up with ophthalmology in 12-24 hours, & to return to ER if new symptoms develop including change in vision, purulent drainage, worsening pain or entrapment.  Evaluation does not show pathology requring ongoing emergent intervention or admission. Pt is hemodynamically stable and mentating appropriately. Discussed findings/results and plan with patient/guardian, who agrees with plan. All questions answered. Return precautions discussed and outpatient follow up given.       Satira Sark [place], [place] 08/12/15 1208  Laurence Spates, MD 08/12/15 1310

## 2015-08-12 NOTE — ED Notes (Signed)
Pt reports one week hx of reddness and itching to l/eye x 1 week. Treated with OTC eye drops with minimal relief. Denies pain, denies trauma

## 2015-08-19 MED FILL — GABAPENTIN 100 MG CAPSULE: 100 | 30 days supply | Qty: 90 | Fill #2

## 2015-09-20 MED FILL — AMLODIPINE BESYLATE 5 MG TA: 5 | 30 days supply | Qty: 15 | Fill #8

## 2015-11-09 ENCOUNTER — Other Ambulatory Visit: Payer: Self-pay | Admitting: Internal Medicine

## 2016-11-19 ENCOUNTER — Ambulatory Visit: Payer: Medicaid Other | Attending: Internal Medicine | Admitting: Rehabilitative and Restorative Service Providers"

## 2016-11-19 DIAGNOSIS — G8929 Other chronic pain: Secondary | ICD-10-CM | POA: Insufficient documentation

## 2016-11-19 DIAGNOSIS — M5442 Lumbago with sciatica, left side: Secondary | ICD-10-CM | POA: Insufficient documentation

## 2016-11-19 DIAGNOSIS — M6281 Muscle weakness (generalized): Secondary | ICD-10-CM | POA: Insufficient documentation
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
## 2016-11-19 NOTE — Patient Instructions (Signed)
HEP issued: Hamstring stretch 2x, 2x/day, 30 sec; Prayer stretch 2x, 2x/day, 30 sec in neutral only; LTR x 5 reps with 5 sec holds, 2x per day, pelvic tilt x 20 2x/day; advised pt he may be sore after performing but as he loosens up, soreness should decrease. If not, he can discontinue. Advised pt of self pay option due to insurance limitation. Pt unable to perform self pay pricing of therex x 2 units at approx cost of $218. Unable to do financial assistance due to having MCD

## 2017-03-29 ENCOUNTER — Inpatient Hospital Stay (HOSPITAL_COMMUNITY): Payer: Medicaid Other

## 2017-03-29 ENCOUNTER — Emergency Department (HOSPITAL_COMMUNITY): Payer: Medicaid Other

## 2017-03-29 ENCOUNTER — Encounter (HOSPITAL_COMMUNITY): Payer: Self-pay | Admitting: Emergency Medicine

## 2017-03-29 ENCOUNTER — Inpatient Hospital Stay (HOSPITAL_COMMUNITY)
Admission: EM | Admit: 2017-03-29 | Discharge: 2017-03-31 | DRG: 683 | Disposition: A | Discharge status: Home / Self Care | Payer: Medicaid Other | Attending: Internal Medicine | Admitting: Internal Medicine

## 2017-03-29 DIAGNOSIS — E872 Acidosis, unspecified: Secondary | ICD-10-CM

## 2017-03-29 DIAGNOSIS — R9431 Abnormal electrocardiogram [ECG] [EKG]: Secondary | ICD-10-CM | POA: Diagnosis present

## 2017-03-29 DIAGNOSIS — E861 Hypovolemia: Secondary | ICD-10-CM | POA: Diagnosis present

## 2017-03-29 DIAGNOSIS — R109 Unspecified abdominal pain: Secondary | ICD-10-CM | POA: Diagnosis not present

## 2017-03-29 DIAGNOSIS — N281 Cyst of kidney, acquired: Secondary | ICD-10-CM | POA: Diagnosis present

## 2017-03-29 DIAGNOSIS — I959 Hypotension, unspecified: Secondary | ICD-10-CM | POA: Diagnosis present

## 2017-03-29 DIAGNOSIS — R52 Pain, unspecified: Secondary | ICD-10-CM

## 2017-03-29 DIAGNOSIS — Z833 Family history of diabetes mellitus: Secondary | ICD-10-CM | POA: Diagnosis not present

## 2017-03-29 DIAGNOSIS — Z79899 Other long term (current) drug therapy: Secondary | ICD-10-CM

## 2017-03-29 DIAGNOSIS — M545 Low back pain: Secondary | ICD-10-CM | POA: Diagnosis present

## 2017-03-29 DIAGNOSIS — Z8249 Family history of ischemic heart disease and other diseases of the circulatory system: Secondary | ICD-10-CM | POA: Diagnosis not present

## 2017-03-29 DIAGNOSIS — Z9049 Acquired absence of other specified parts of digestive tract: Secondary | ICD-10-CM

## 2017-03-29 DIAGNOSIS — M549 Dorsalgia, unspecified: Secondary | ICD-10-CM

## 2017-03-29 DIAGNOSIS — R0981 Nasal congestion: Secondary | ICD-10-CM | POA: Diagnosis present

## 2017-03-29 DIAGNOSIS — J449 Chronic obstructive pulmonary disease, unspecified: Secondary | ICD-10-CM | POA: Diagnosis present

## 2017-03-29 DIAGNOSIS — N179 Acute kidney failure, unspecified: Secondary | ICD-10-CM | POA: Diagnosis present

## 2017-03-29 DIAGNOSIS — F17210 Nicotine dependence, cigarettes, uncomplicated: Secondary | ICD-10-CM | POA: Diagnosis present

## 2017-03-29 DIAGNOSIS — G8929 Other chronic pain: Secondary | ICD-10-CM | POA: Diagnosis present

## 2017-03-29 DIAGNOSIS — E860 Dehydration: Secondary | ICD-10-CM | POA: Diagnosis present

## 2017-03-29 DIAGNOSIS — I10 Essential (primary) hypertension: Secondary | ICD-10-CM | POA: Diagnosis present

## 2017-03-29 DIAGNOSIS — A084 Viral intestinal infection, unspecified: Secondary | ICD-10-CM | POA: Diagnosis present

## 2017-03-29 LAB — COMPREHENSIVE METABOLIC PANEL
ALK PHOS: 43 U/L (ref 38–126)
ALT: 13 U/L — ABNORMAL LOW (ref 17–63)
AST: 20 U/L (ref 15–41)
Albumin: 4.4 g/dL (ref 3.5–5.0)
Anion gap: 15 (ref 5–15)
BUN: 66 mg/dL — ABNORMAL HIGH (ref 6–20)
CALCIUM: 9.4 mg/dL (ref 8.9–10.3)
CO2: 19 mmol/L — AB (ref 22–32)
Chloride: 102 mmol/L (ref 101–111)
Creatinine, Ser: 7.14 mg/dL — ABNORMAL HIGH (ref 0.61–1.24)
GFR calc Af Amer: 9 mL/min — ABNORMAL LOW (ref >60)
GFR calc non Af Amer: 7 mL/min — ABNORMAL LOW (ref >60)
GLUCOSE: 161 mg/dL — AB (ref 65–99)
Potassium: 4.3 mmol/L (ref 3.5–5.1)
SODIUM: 136 mmol/L (ref 135–145)
Total Bilirubin: 0.8 mg/dL (ref 0.3–1.2)
Total Protein: 8.1 g/dL (ref 6.5–8.1)

## 2017-03-29 LAB — CBC
HCT: 43.3 % (ref 39.0–52.0)
Hemoglobin: 14 g/dL (ref 13.0–17.0)
MCH: 26.9 pg (ref 26.0–34.0)
MCHC: 32.3 g/dL (ref 30.0–36.0)
MCV: 83.3 fL (ref 78.0–100.0)
PLATELETS: 338 10*3/uL (ref 150–400)
RBC: 5.2 MIL/uL (ref 4.22–5.81)
RDW: 16.1 % — ABNORMAL HIGH (ref 11.5–15.5)
WBC: 9 10*3/uL (ref 4.0–10.5)

## 2017-03-29 LAB — LIPASE, BLOOD: Lipase: 28 U/L (ref 11–51)

## 2017-03-29 LAB — CK: Total CK: 521 U/L — ABNORMAL HIGH (ref 49–397)

## 2017-03-29 LAB — I-STAT TROPONIN, ED: TROPONIN I, POC: 0.02 ng/mL (ref 0.00–0.08)

## 2017-03-29 LAB — I-STAT CG4 LACTIC ACID, ED: Lactic Acid, Venous: 1.58 mmol/L (ref 0.5–1.9)

## 2017-03-29 MED ORDER — ACETAMINOPHEN 325 MG PO TABS: 650.0000 mg | ORAL_TABLET | Freq: Four times a day (QID) | ORAL | Status: DC | PRN

## 2017-03-29 MED ORDER — LACTATED RINGERS IV BOLUS (SEPSIS)
1000.0000 mL | Freq: Once | INTRAVENOUS | Status: AC
  Administered 2017-03-29: 1000 mL via INTRAVENOUS

## 2017-03-29 MED ORDER — ONDANSETRON HCL 4 MG PO TABS
4.0000 mg | ORAL_TABLET | Freq: Four times a day (QID) | ORAL | Status: DC | PRN
Start: 2017-03-29 — End: 2017-03-31

## 2017-03-29 MED ORDER — ACETAMINOPHEN 650 MG RE SUPP: 650.0000 mg | Freq: Four times a day (QID) | RECTAL | Status: DC | PRN

## 2017-03-29 MED ORDER — SODIUM CHLORIDE 0.9 % IV SOLN
INTRAVENOUS | Status: DC
  Administered 2017-03-29: 23:00:00 via INTRAVENOUS

## 2017-03-29 MED ORDER — ALBUTEROL SULFATE (2.5 MG/3ML) 0.083% IN NEBU: 3.0000 mL | INHALATION_SOLUTION | Freq: Four times a day (QID) | RESPIRATORY_TRACT | Status: DC | PRN

## 2017-03-29 MED ORDER — MONTELUKAST SODIUM 10 MG PO TABS
10.0000 mg | ORAL_TABLET | Freq: Every day | ORAL | Status: DC
  Administered 2017-03-30: 10 mg via ORAL
  Filled 2017-03-29: qty 1

## 2017-03-29 MED ORDER — FENTANYL CITRATE (PF) 100 MCG/2ML IJ SOLN: 50.0000 ug | INTRAMUSCULAR | Status: DC | PRN

## 2017-03-29 MED ORDER — GABAPENTIN 100 MG PO CAPS
100.0000 mg | ORAL_CAPSULE | Freq: Three times a day (TID) | ORAL | Status: DC
  Administered 2017-03-30 – 2017-03-31 (×4): 100 mg via ORAL
  Filled 2017-03-29 (×4): qty 1

## 2017-03-29 MED ORDER — ONDANSETRON HCL 4 MG/2ML IJ SOLN: 4.0000 mg | Freq: Four times a day (QID) | INTRAMUSCULAR | Status: DC | PRN

## 2017-03-29 MED ORDER — HYDROCODONE-ACETAMINOPHEN 5-325 MG PO TABS: 1.0000 | ORAL_TABLET | ORAL | Status: DC | PRN

## 2017-03-29 MED ORDER — HEPARIN SODIUM (PORCINE) 5000 UNIT/ML IJ SOLN
5000.0000 [IU] | Freq: Three times a day (TID) | INTRAMUSCULAR | Status: DC
  Administered 2017-03-30 (×2): 5000 [IU] via SUBCUTANEOUS
  Filled 2017-03-29 (×2): qty 1

## 2017-03-29 MED ORDER — FLUTICASONE PROPIONATE 50 MCG/ACT NA SUSP
2.0000 | Freq: Every day | NASAL | Status: DC
  Administered 2017-03-30 – 2017-03-31 (×2): 2 via NASAL
  Filled 2017-03-29: qty 16

## 2017-03-29 NOTE — H&P (Addendum)
History and Physical    Ivan Guerrero ZOX:[id number] DOB: Jun 12, 1956 DOA: 03/29/2017  PCP:  Doris Cheadle, MD  Patient coming from: Home  Chief Complaint: Abdominal pain   HPI: Ivan Guerrero is a 61 y.o. male with medical history significant for hypertension, chronic low back pain, and seasonal allergies Coumadin presenting to the emergency department for evaluation of acute abdominal pain. Patient reports that he was in his usual state of health when he woke up this morning, but went on to develop abdominal pain with some loose stools over the course of the day. Patient reports that he was doing intense manual labor outside when the symptoms developed. Abdominal pain is described as moderate to severe in intensity, sharp in character, localized to the mid- and lower abdomen, without nausea or vomiting, but with several loose stools today. He denies melena or hematochezia. He denies experiencing similar symptoms previously. He has not urinated today. At baseline, he has a little bit of difficulty with urinating, describes a stuttering stream. Denies fevers or chills, but reports feeling very cold currently. He reports continued adherence with his blood pressure medications, lisinopril-HCTZ, and also takes sulindac daily for his chronic back pain. Denies any other NSAID use. Previously was taking Prilosec, but has not taken this in some time.  ED Course: Upon arrival to the ED, patient is found to be afebrile, saturating adequately on room air, with blood pressure 77/59, and normal heart rate and respirations. EKG features a sinus rhythm with short PR interval, similar to prior. Chest x-ray is negative for acute pulmonary disease. CBC is unremarkable. Lactic acid is normal, troponin also normal. She feels a BUN of 66 and creatinine of 7.14, up from the 1 range on most recent labs which were 2 years ago. Patient was treated with 2 L normal saline in the ED. Blood pressure improved with the IV fluids and the  patient remained hemodynamically stable and in no apparent respiratory distress. He will be admitted to the telemetry unit for ongoing evaluation and management of acute renal failure, possibly prerenal, but with obstruction not yet excluded.  Review of Systems:  All other systems reviewed and apart from HPI, are negative.  Past Medical History:  Diagnosis Date  . Chronic back pain   . Gallstones   . Hypertension     Past Surgical History:  Procedure Laterality Date  . CHOLECYSTECTOMY       reports that he has been smoking Cigarettes.  He has a 35.00 pack-year smoking history. He has never used smokeless tobacco. He reports that he drinks alcohol. He reports that he does not use drugs.  No Known Allergies  Family History  Problem Relation Age of Onset  . Hypertension Mother   . Diabetes Mother   . Hypertension Father   . Cancer Father 75  . Hypertension Sister   . Diabetes Brother      Prior to Admission medications   Medication Sig Start Date End Date Taking? Authorizing Provider  amLODipine (NORVASC) 5 MG tablet Take 0.5 tablets (2.5 mg total) by mouth daily. Must have office visit for refills Patient taking differently: Take 5 mg by mouth daily. Must have office visit for refills 11/09/15  Yes Jegede, Olugbemiga E, MD  fluticasone (FLONASE) 50 MCG/ACT nasal spray Place 2 sprays into both nostrils daily. 02/19/14  Yes Advani, Ayesha Rumpf, MD  gabapentin (NEURONTIN) 400 MG capsule Take 400 mg by mouth 3 (three) times daily. 03/04/17  Yes [provider]  lisinopril-hydrochlorothiazide (PRINZIDE,ZESTORETIC) 20-12.5 MG  tablet Take 1 tablet by mouth daily. 03/04/17  Yes [provider]  montelukast (SINGULAIR) 10 MG tablet Take 10 mg by mouth at bedtime. 03/13/17  Yes [provider]  PROAIR HFA 108 (90 Base) MCG/ACT inhaler Inhale 2 Pump into the lungs 2 (two) times daily. 02/05/17  Yes [provider]  sulindac (CLINORIL) 200 MG tablet Take 200 mg by  mouth 2 (two) times daily. 01/02/17  Yes [provider]    Physical Exam: Vitals:   03/29/17 2055 03/29/17 2100 03/29/17 2116 03/29/17 2119  BP: 103/60 (!) 96/59 101/64   Pulse: 73 74 75   Resp: Temp:    (!) 97 F (36.1 C)  TempSrc:    Rectal  SpO2: 100% 100% 100%   Weight:      Height:          Constitutional: NAD, calm, in apparent discomfort Eyes: PERTLA, lids and conjunctivae normal ENMT: Mucous membranes are moist. Posterior pharynx clear of any exudate or lesions.   Neck: normal, supple, no masses, no thyromegaly Respiratory: clear to auscultation bilaterally, no wheezing, no crackles. Normal respiratory effort.   Cardiovascular: S1 & S2 heard, regular rate and rhythm. No extremity edema. No significant JVD. Abdomen: No distension, soft, tender in mid and lower abd without rebound pain or guarding. Bowel sounds active.  Musculoskeletal: no clubbing / cyanosis. No joint deformity upper and lower extremities.  Skin: no significant rashes, lesions, ulcers. Warm, dry, well-perfused. Neurologic: CN 2-12 grossly intact. Sensation intact. Strength 5/5 in all 4 limbs.  Psychiatric: Alert and oriented x 3. Calm, cooperative.     Labs on Admission: I have personally reviewed following labs and imaging studies  CBC:  Recent Labs Lab 03/29/17 1957  WBC 9.0  HGB 14.0  HCT 43.3  MCV 83.3  PLT 338   Basic Metabolic Panel:  Recent Labs Lab 03/29/17 1957  NA 136  K 4.3  CL 102  CO2 19*  GLUCOSE 161*  BUN 66*  CREATININE 7.14*  CALCIUM 9.4   GFR: Estimated Creatinine Clearance: 11.1 mL/min (A) (by C-G formula based on SCr of 7.14 mg/dL (H)). Liver Function Tests:  Recent Labs Lab 03/29/17 1957  AST 20  ALT 13*  ALKPHOS 43  BILITOT 0.8  PROT 8.1  ALBUMIN 4.4    Recent Labs Lab 03/29/17 1957  LIPASE 28   No results for input(s): AMMONIA in the last 168 hours. Coagulation Profile: No results for input(s): INR, PROTIME in the last  168 hours. Cardiac Enzymes:  Recent Labs Lab 03/29/17 2016  CKTOTAL 521*   BNP (last 3 results) No results for input(s): PROBNP in the last 8760 hours. HbA1C: No results for input(s): HGBA1C in the last 72 hours. CBG: No results for input(s): GLUCAP in the last 168 hours. Lipid Profile: No results for input(s): CHOL, HDL, LDLCALC, TRIG, CHOLHDL, LDLDIRECT in the last 72 hours. Thyroid Function Tests: No results for input(s): TSH, T4TOTAL, FREET4, T3FREE, THYROIDAB in the last 72 hours. Anemia Panel: No results for input(s): VITAMINB12, FOLATE, FERRITIN, TIBC, IRON, RETICCTPCT in the last 72 hours. Urine analysis: No results found for: COLORURINE, APPEARANCEUR, LABSPEC, PHURINE, GLUCOSEU, HGBUR, BILIRUBINUR, KETONESUR, PROTEINUR, UROBILINOGEN, NITRITE, LEUKOCYTESUR Sepsis Labs: (procalcitonin:4,lacticidven:4) )No results found for this or any previous visit (from the past 240 hour(s)).   Radiological Exams on Admission: Dg Chest Port 1 View  Result Date: 03/29/2017 CLINICAL DATA:  Right shoulder pain hypotension and weakness EXAM: PORTABLE CHEST 1 VIEW COMPARISON:  02/10/2014 FINDINGS: Rotated patient. No acute consolidation or effusion. Cardiomediastinal silhouette within normal limits. No pneumothorax. IMPRESSION: No active disease. Electronically Signed   By: Jasmine Pang M.D.   On: 03/29/2017 20:34    EKG: Independently reviewed. Sinus rhythm with short PR-interval, similar to prior.   Assessment/Plan  1. Acute renal failure  - Presents with acute abdominal pain and found to be in ARF  - SCr is 7.14 on admission, up from 1-range on most recent priors from 2 yrs ago  - No significant acidosis, and no hyperkalemia or hypervolemia present on admission  - Likely secondary to obstruction vs hypovolemia, with continued use of lisinopril and NSAID possibly contributing - Plan to check renal US, place foley as needed, check urine studies, continue IVF, repeat  chemistries in am    2. Hypotension, hx of HTN  - BP 72/59 on arrival, improving with IVF  - No leukocytosis or fever; lactate reassuringly normal  - Possibly secondary to hypovolemia in setting loose stools and working outside on day of admit  - He takes lisinopril-HCTZ and Norvasc at home; these are held on admission in light of AKI and hypotension   3. Abdominal pain  - Uncertain etiology  - He reports one day of loose stool - LFT's wnl and abdomen is soft  - Continue supportive care and monitoring    4. Chronic back pain  - Stable  - Continue gabapentin and hold sulindac in light of ARF     DVT prophylaxis: sq heparin  Code Status: Full  Family Communication: Discussed with patient Disposition Plan: Admit to telemetry Consults called: None Admission status: Inpatient    Briscoe Deutscher, MD [facility] Pager [phone]  If 7PM-7AM, please contact night-coverage [email or web address] Password [email or web address]  03/29/2017, 10:01 PM

## 2017-03-29 NOTE — ED Triage Notes (Signed)
Reporting diffuse abdominal pain that started today after working outside all day mowing grass.  Also c/o right side shoulder pain.  BP noted to be 82/60 in triage and having generalized weakness.

## 2017-03-29 NOTE — ED Notes (Signed)
1L bolus of NS started with pressure bag.

## 2017-03-29 NOTE — ED Notes (Signed)
Pt still unable to give urine specimen.

## 2017-03-29 NOTE — ED Notes (Signed)
2nd liter of NS bolus started.

## 2017-03-29 NOTE — ED Provider Notes (Signed)
[facility]-EMERGENCY DEPT Provider Note   CSN: [id number] Arrival date & time: 03/29/17  1928     History   Chief Complaint Chief Complaint  Patient presents with  . Abdominal Pain  . Shoulder Pain    HPI Ivan Guerrero is a 61 y.o. male.  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  His blood pressure was noted to be 82/60 in triage.  Endorses generalized weakness as well, but denied chest pain, dyspnea, blurry vision, numbness or tingling in his extremities, decreased motor strength.  Denies changes in urination, hematuria, dysuria, changes in stool.   The history is provided by the patient.    Past Medical History:  Diagnosis Date  . Chronic back pain   . Gallstones   . Hypertension     Patient Active Problem List   Diagnosis Date Noted  . Hypertension 03/29/2017  . Acute renal failure (ARF) (HCC) 03/29/2017  . Chronic back pain 03/29/2017  . Acute abdominal pain 03/29/2017  . Hypotension 03/29/2017  . Smoking 02/19/2014  . Elevated BP 02/19/2014  . Nasal congestion 02/19/2014    Past Surgical History:  Procedure Laterality Date  . CHOLECYSTECTOMY         Home Medications    Prior to Admission medications   Medication Sig Start Date End Date Taking? Authorizing Provider  amLODipine (NORVASC) 5 MG tablet Take 0.5 tablets (2.5 mg total) by mouth daily. Must have office visit for refills Patient taking differently: Take 5 mg by mouth daily. Must have office visit for refills 11/09/15  Yes Jegede, Olugbemiga E, MD  fluticasone (FLONASE) 50 MCG/ACT nasal spray Place 2 sprays into both nostrils daily. 02/19/14  Yes Advani, Ayesha Rumpf, MD  gabapentin (NEURONTIN) 400 MG capsule Take 400 mg by mouth 3 (three) times daily. 03/04/17  Yes [provider]  lisinopril-hydrochlorothiazide (PRINZIDE,ZESTORETIC) 20-12.5 MG  tablet Take 1 tablet by mouth daily. 03/04/17  Yes [provider]  montelukast (SINGULAIR) 10 MG tablet Take 10 mg by mouth at bedtime. 03/13/17  Yes [provider]  PROAIR HFA 108 (90 Base) MCG/ACT inhaler Inhale 2 Pump into the lungs 2 (two) times daily. 02/05/17  Yes [provider]  sulindac (CLINORIL) 200 MG tablet Take 200 mg by mouth 2 (two) times daily. 01/02/17  Yes [provider]    Family History Family History  Problem Relation Age of Onset  . Hypertension Mother   . Diabetes Mother   . Hypertension Father   . Cancer Father 42  . Hypertension Sister   . Diabetes Brother     Social History Social History  Substance Use Topics  . Smoking status: Current Every Day Smoker    Packs/day: 1.00    Years: 35.00    Types: Cigarettes  . Smokeless tobacco: Never Used     Comment: Smoking 1 ppd  . Alcohol use 0.0 oz/week     Comment: occ     Allergies   Patient has no known allergies.   Review of Systems Review of Systems  Constitutional: Negative for chills and fever.  HENT: Negative for ear pain and sore throat.   Eyes: Negative for pain and visual disturbance.  Respiratory: Negative for cough, chest tightness, shortness of breath and wheezing.   Cardiovascular: Negative for chest pain, palpitations and leg swelling.  Gastrointestinal: Positive for diarrhea. Negative for abdominal  pain, anal bleeding, blood in stool, constipation, nausea and vomiting.  Genitourinary: Negative for difficulty urinating, dysuria and hematuria.  Musculoskeletal: Negative for arthralgias and back pain.  Skin: Negative for color change and rash.  Neurological: Negative for seizures and syncope.  All other systems reviewed and are negative.    Physical Exam Updated Vital Signs BP 101/71   Pulse 73   Temp (!) 97 F (36.1 C) (Rectal)   Resp 13   Ht  (1.753 m)   Wt 79.4 kg (175 lb)   SpO2 100%   BMI 25.84 kg/m   Physical Exam    Constitutional: He appears well-developed and well-nourished.  HENT:  Head: Normocephalic and atraumatic.  Eyes: Pupils are equal, round, and reactive to light. Conjunctivae are normal.  Neck: Normal range of motion. Neck supple.  Cardiovascular: Normal rate and regular rhythm.   No murmur heard. Pulmonary/Chest: Effort normal and breath sounds normal. No respiratory distress.  Abdominal: Soft. There is tenderness in the periumbilical area. There is no rigidity, no rebound, no guarding and no CVA tenderness.  Musculoskeletal: He exhibits no edema.  Neurological: He is alert.  Skin: Skin is warm. Capillary refill takes 2 to 3 seconds. He is diaphoretic.  Psychiatric: He has a normal mood and affect.  Nursing note and vitals reviewed.  ED Treatments / Results  Labs (all labs ordered are listed, but only abnormal results are displayed) Labs Reviewed  COMPREHENSIVE METABOLIC PANEL - Abnormal; Notable for the following:       Result Value   CO2 19 (*)    Glucose, Bld 161 (*)    BUN 66 (*)    Creatinine, Ser 7.14 (*)    ALT 13 (*)    GFR calc non Af Amer 7 (*)    GFR calc Af Amer 9 (*)    All other components within normal limits  CBC - Abnormal; Notable for the following:    RDW 16.1 (*)    All other components within normal limits  CK - Abnormal; Notable for the following:    Total CK 521 (*)    All other components within normal limits  LIPASE, BLOOD  URINALYSIS, ROUTINE W REFLEX MICROSCOPIC  SODIUM, URINE, RANDOM  UREA NITROGEN, URINE  CREATININE, URINE, RANDOM  HIV ANTIBODY (ROUTINE TESTING)  BASIC METABOLIC PANEL  CBC  I-STAT TROPONIN, ED  I-STAT CG4 LACTIC ACID, ED    EKG  EKG Interpretation None       Radiology Dg Chest Port 1 View  Result Date: 03/29/2017 CLINICAL DATA:  Right shoulder pain hypotension and weakness EXAM: PORTABLE CHEST 1 VIEW COMPARISON:  02/10/2014 FINDINGS: Rotated patient. No acute consolidation or effusion. Cardiomediastinal  silhouette within normal limits. No pneumothorax. IMPRESSION: No active disease. Electronically Signed   By: Jasmine Pang M.D.   On: 03/29/2017 20:34    Procedures Procedures (including critical care time)  Medications Ordered in ED Medications  lactated ringers bolus 1,000 mL (1,000 mLs Intravenous New Bag/Given 03/29/17 2200)  gabapentin (NEURONTIN) capsule 100 mg (not administered)  montelukast (SINGULAIR) tablet 10 mg (not administered)  albuterol (PROVENTIL) (2.5 MG/3ML) 0.083% nebulizer solution 3 mL (not administered)  fluticasone (FLONASE) 50 MCG/ACT nasal spray 2 spray (not administered)  heparin injection 5,000 Units (not administered)  0.9 %  sodium chloride infusion ( Intravenous New Bag/Given 03/29/17 2241)  acetaminophen (TYLENOL) tablet 650 mg (not administered)    Or  acetaminophen (TYLENOL) suppository 650 mg (not administered)  HYDROcodone-acetaminophen (NORCO/VICODIN) 5-325 MG per  tablet 1-2 tablet (not administered)  ondansetron (ZOFRAN) tablet 4 mg (not administered)    Or  ondansetron (ZOFRAN) injection 4 mg (not administered)  fentaNYL (SUBLIMAZE) injection 50 mcg (not administered)  lactated ringers bolus 1,000 mL (0 mLs Intravenous Stopped 03/29/17 2240)     Initial Impression / Assessment and Plan / ED Course  I have reviewed the triage vital signs and the nursing notes.  Pertinent labs & imaging results that were available during my care of the patient were reviewed by me and considered in my medical decision making (see chart for details).     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  On arrival blood pressure noted above, neurologically intact, fully conversive, alert and oriented 4 with a diffusely tender abdomen, non-peritoneal.  Patient immediately given 1 L normal saline followed by  multiple liters LR. Urine not able to be obtained due to dehydration. BP improved to 101/64.  Lactate 1.5, troponin 0.02.  CK 521, CBC shows no leukocytosis or anemia.  CMB concerning for metabolic acidosis with BUN elevation and new creatinine of 7.14 compared to prior of 0.9.  Discussed admission with hospitalist for further evaluation.  Discussed plan with patient who is in agreement. All questions answered.  Final Clinical Impressions(s) / ED Diagnoses   Final diagnoses:  Pain  Acute renal failure (ARF) (HCC)   New Prescriptions New Prescriptions   No medications on file      Shaune Pollack, MD 03/29/17 2246    Jacalyn Lefevre, MD 03/29/17 [phone]

## 2017-03-30 ENCOUNTER — Encounter (HOSPITAL_COMMUNITY): Payer: Self-pay | Admitting: General Practice

## 2017-03-30 LAB — URINALYSIS, ROUTINE W REFLEX MICROSCOPIC
Bilirubin Urine: NEGATIVE
Glucose, UA: NEGATIVE mg/dL
KETONES UR: NEGATIVE mg/dL
NITRITE: NEGATIVE
PH: 5 (ref 5.0–8.0)
Protein, ur: 30 mg/dL — AB
Specific Gravity, Urine: 1.013 (ref 1.005–1.030)

## 2017-03-30 LAB — CBC
HCT: 37.5 % — ABNORMAL LOW (ref 39.0–52.0)
HEMOGLOBIN: 12 g/dL — AB (ref 13.0–17.0)
MCH: 26.7 pg (ref 26.0–34.0)
MCHC: 32 g/dL (ref 30.0–36.0)
MCV: 83.5 fL (ref 78.0–100.0)
Platelets: 256 10*3/uL (ref 150–400)
RBC: 4.49 MIL/uL (ref 4.22–5.81)
RDW: 15.9 % — AB (ref 11.5–15.5)
WBC: 8.7 10*3/uL (ref 4.0–10.5)

## 2017-03-30 LAB — BASIC METABOLIC PANEL
ANION GAP: 9 (ref 5–15)
BUN: 57 mg/dL — AB (ref 6–20)
CALCIUM: 7.8 mg/dL — AB (ref 8.9–10.3)
CO2: 19 mmol/L — ABNORMAL LOW (ref 22–32)
Chloride: 109 mmol/L (ref 101–111)
Creatinine, Ser: 4.82 mg/dL — ABNORMAL HIGH (ref 0.61–1.24)
GFR calc Af Amer: 14 mL/min — ABNORMAL LOW (ref >60)
GFR calc non Af Amer: 12 mL/min — ABNORMAL LOW (ref >60)
GLUCOSE: 109 mg/dL — AB (ref 65–99)
POTASSIUM: 4.1 mmol/L (ref 3.5–5.1)
Sodium: 137 mmol/L (ref 135–145)

## 2017-03-30 LAB — CREATININE, URINE, RANDOM: Creatinine, Urine: 154.9 mg/dL

## 2017-03-30 LAB — HIV ANTIBODY (ROUTINE TESTING W REFLEX): HIV Screen 4th Generation wRfx: NONREACTIVE

## 2017-03-30 LAB — SODIUM, URINE, RANDOM: Sodium, Ur: 65 mmol/L

## 2017-03-30 LAB — GLUCOSE, CAPILLARY: GLUCOSE-CAPILLARY: 69 mg/dL (ref 65–99)

## 2017-03-30 NOTE — Progress Notes (Signed)
Patient ID: Ivan Guerrero, male   DOB: 09-10-1955, 61 y.o.   MRN: [id number]    PROGRESS NOTE    Ivan Guerrero  MVH:[id number] DOB: [birth year] DOA: 03/29/2017  PCP: Doris Cheadle, MD   Brief Narrative:  61 y.o. male with medical history significant for hypertension, chronic low back pain, and seasonal allergies, presenting to the emergency department for evaluation of acute abdominal pain associated with loose stools, poor urine output despite drinking fluids.   In ER, pt had SBP in 70's and was started on IVF. Blood work notable for Cr 7.14 and [facility] asked to admit for further evaluation.   Assessment & Plan:   Acute renal failure  - suspect pre renal etiology in setting of Lisinopril - HCTZ use and possible underlying obstruction - IVF were provided and Cr is now trending down - pt wants to eat regular diet and will advance - stop IVF and repeat BMP in AM - hold nephrotoxic medications   Hypotension, hx of HTN  - hypovolemia and use of antihypertensives - improving overall - no need for antihypertensives at this time   Abdominal pain  - Uncertain etiology and possibly related to the above - renal US with benign appearing right renal cysts but I do not suspect this is contributing - conservative measures for now - advance diet   Chronic back pain  - Stable  - continue Gabapentin   DVT prophylaxis: Heparin SQ Code Status: Full  Family Communication: Patient at bedside  Disposition Plan: Home in AM in Cr improving   Consultants:   None  Procedures:   None  Antimicrobials:   None  Subjective: Pt reports feeling better this AM, wants to have his diet advanced to regular.   Objective: Vitals:   03/29/17 2355 03/30/17 0100 03/30/17 0145 03/30/17 0623  BP: 118/70 98/64 114/72 102/85  Pulse: 67 66 68 63  Resp: Temp:   97.9 F (36.6 C) 97.9 F (36.6 C)  TempSrc:   Oral Oral  SpO2: 100% 100% 100% 100%  Weight:   79.5 kg (175 lb 4.3 oz)   Height:     (1.727 m)     Intake/Output Summary (Last 24 hours) at 03/30/17 1159 Last data filed at 03/30/17 0600  Gross per 24 hour  Intake           5097.5 ml  Output                0 ml  Net           5097.5 ml   Filed Weights   03/29/17 1957 03/30/17 0145  Weight: 79.4 kg (175 lb) 79.5 kg (175 lb 4.3 oz)    Examination:  General exam: Appears calm and comfortable  Respiratory system: Clear to auscultation. Respiratory effort normal. Cardiovascular system: S1 & S2 heard, RRR. No JVD, rubs, gallops or clicks. No pedal edema. Gastrointestinal system: Abdomen is nondistended, soft and nontender. No organomegaly or masses felt. Normal bowel sounds heard. Central nervous system: Alert and oriented. No focal neurological deficits. Extremities: Symmetric 5 x 5 power. Skin: No rashes, lesions or ulcers Psychiatry: Judgement and insight appear normal. Mood & affect appropriate.  Data Reviewed: I have personally reviewed following labs and imaging studies  CBC:  Recent Labs Lab 03/29/17 1957 03/30/17 0239  WBC 9.0 8.7  HGB 14.0 12.0*  HCT 43.3 37.5*  MCV 83.3 83.5  PLT 338 256   Basic Metabolic Panel:  Recent Labs Lab  03/29/17 1957 03/30/17 0239  NA 136 137  K 4.3 4.1  CL 102 109  CO2 19* 19*  GLUCOSE 161* 109*  BUN 66* 57*  CREATININE 7.14* 4.82*  CALCIUM 9.4 7.8*   Liver Function Tests:  Recent Labs Lab 03/29/17 1957  AST 20  ALT 13*  ALKPHOS 43  BILITOT 0.8  PROT 8.1  ALBUMIN 4.4    Recent Labs Lab 03/29/17 1957  LIPASE 28   Cardiac Enzymes:  Recent Labs Lab 03/29/17 2016  CKTOTAL 521*   CBG:  Recent Labs Lab 03/30/17 0743  GLUCAP 69   Urine analysis:    Component Value Date/Time   COLORURINE YELLOW 03/30/2017 0004   APPEARANCEUR CLOUDY (A) 03/30/2017 0004   LABSPEC 1.013 03/30/2017 0004   PHURINE 5.0 03/30/2017 0004   GLUCOSEU NEGATIVE 03/30/2017 0004   HGBUR SMALL (A) 03/30/2017 0004   BILIRUBINUR NEGATIVE 03/30/2017 0004    KETONESUR NEGATIVE 03/30/2017 0004   PROTEINUR 30 (A) 03/30/2017 0004   NITRITE NEGATIVE 03/30/2017 0004   LEUKOCYTESUR TRACE (A) 03/30/2017 0004   Radiology Studies: US Renal  Result Date: 03/29/2017 CLINICAL DATA:  Acute renal failure EXAM: RENAL / URINARY TRACT ULTRASOUND COMPLETE COMPARISON:  None. FINDINGS: Right Kidney: Length: 12.8 cm.  No hydronephrosis.  Two anechoic round cysts. Left Kidney: Length: 11.8 cm.  No hydronephrosis Bladder: Appears normal for degree of bladder distention. IMPRESSION: 1. No acute renal findings. 2. Benign-appearing RIGHT renal cysts. Electronically Signed   By: Genevive Bi M.D.   On: 03/29/2017 23:37   Dg Chest Port 1 View  Result Date: 03/29/2017 CLINICAL DATA:  Right shoulder pain hypotension and weakness EXAM: PORTABLE CHEST 1 VIEW COMPARISON:  02/10/2014 FINDINGS: Rotated patient. No acute consolidation or effusion. Cardiomediastinal silhouette within normal limits. No pneumothorax. IMPRESSION: No active disease. Electronically Signed   By: Jasmine Pang M.D.   On: 03/29/2017 20:34   Scheduled Meds: . fluticasone  2 spray Each Nare Daily  . gabapentin  100 mg Oral TID  . heparin  5,000 Units Subcutaneous Q8H  . montelukast  10 mg Oral QHS   Continuous Infusions:   LOS: 1 day   Time spent: 25 minutes   Debbora Presto, MD [facility] Pager [phone]  If 7PM-7AM, please contact night-coverage [email or web address] Password [facility] 03/30/2017, 11:59 AM

## 2017-03-31 DIAGNOSIS — I10 Essential (primary) hypertension: Secondary | ICD-10-CM

## 2017-03-31 DIAGNOSIS — I959 Hypotension, unspecified: Secondary | ICD-10-CM

## 2017-03-31 DIAGNOSIS — E872 Acidosis, unspecified: Secondary | ICD-10-CM

## 2017-03-31 DIAGNOSIS — N179 Acute kidney failure, unspecified: Principal | ICD-10-CM

## 2017-03-31 LAB — BASIC METABOLIC PANEL
Anion gap: 6 (ref 5–15)
BUN: 32 mg/dL — AB (ref 6–20)
CO2: 24 mmol/L (ref 22–32)
Calcium: 8 mg/dL — ABNORMAL LOW (ref 8.9–10.3)
Chloride: 108 mmol/L (ref 101–111)
Creatinine, Ser: 1.65 mg/dL — ABNORMAL HIGH (ref 0.61–1.24)
GFR calc Af Amer: 51 mL/min — ABNORMAL LOW (ref >60)
GFR calc non Af Amer: 44 mL/min — ABNORMAL LOW (ref >60)
GLUCOSE: 114 mg/dL — AB (ref 65–99)
Potassium: 3.5 mmol/L (ref 3.5–5.1)
Sodium: 138 mmol/L (ref 135–145)

## 2017-03-31 LAB — UREA NITROGEN, URINE: UREA NITROGEN UR: 416 mg/dL

## 2017-03-31 LAB — CBC
HCT: 32.3 % — ABNORMAL LOW (ref 39.0–52.0)
Hemoglobin: 10.5 g/dL — ABNORMAL LOW (ref 13.0–17.0)
MCH: 27 pg (ref 26.0–34.0)
MCHC: 32.5 g/dL (ref 30.0–36.0)
MCV: 83 fL (ref 78.0–100.0)
PLATELETS: 229 10*3/uL (ref 150–400)
RBC: 3.89 MIL/uL — ABNORMAL LOW (ref 4.22–5.81)
RDW: 15.6 % — AB (ref 11.5–15.5)
WBC: 5 10*3/uL (ref 4.0–10.5)

## 2017-03-31 LAB — GLUCOSE, CAPILLARY: Glucose-Capillary: 90 mg/dL (ref 65–99)

## 2017-03-31 MED ORDER — POLYVINYL ALCOHOL 1.4 % OP SOLN: 1.0000 [drp] | OPHTHALMIC | 0 refills | Status: AC | PRN

## 2017-03-31 MED ORDER — POLYVINYL ALCOHOL 1.4 % OP SOLN
1.0000 [drp] | OPHTHALMIC | Status: DC | PRN
  Administered 2017-03-31: 1 [drp] via OPHTHALMIC
  Filled 2017-03-31: qty 15

## 2017-03-31 NOTE — Discharge Instructions (Signed)
Acute Kidney Injury, Adult Acute kidney injury is a sudden worsening of kidney function. The kidneys are organs that have several jobs. They filter the blood to remove waste products and extra fluid. They also maintain a healthy balance of minerals and hormones in the body, which helps control blood pressure and keep bones strong. With this condition, your kidneys do not do their jobs as well as they should. This condition ranges from mild to severe. Over time it may develop into long-lasting (chronic) kidney disease. Early detection and treatment may prevent acute kidney injury from developing into a chronic condition. What are the causes? Common causes of this condition include:  A problem with blood flow to the kidneys. This may be caused by:  Low blood pressure (hypotension) or shock.  Blood loss.  Heart and blood vessel (cardiovascular) disease.  Severe burns.  Liver disease.  Direct damage to the kidneys. This may be caused by:  Certain medicines.  A kidney infection.  Poisoning.  Being around or in contact with toxic substances.  A surgical wound.  A hard, direct hit to the kidney area.  A sudden blockage of urine flow. This may be caused by:  Cancer.  Kidney stones.  An enlarged prostate in males. What are the signs or symptoms? Symptoms of this condition may not be obvious until the condition becomes severe. Symptoms of this condition can include:  Tiredness (lethargy), or difficulty staying awake.  Nausea or vomiting.  Swelling (edema) of the face, legs, ankles, or feet.  Problems with urination, such as:  Abdominal pain, or pain along the side of your stomach (flank).  Decreased urine production.  Decrease in the force of urine flow.  Muscle twitches and cramps, especially in the legs.  Confusion or trouble concentrating.  Loss of appetite.  Fever. How is this diagnosed? This condition may be diagnosed with tests, including:  Blood  tests.  Urine tests.  Imaging tests.  A test in which a sample of tissue is removed from the kidneys to be examined under a microscope (kidney biopsy). How is this treated? Treatment for this condition depends on the cause and how severe the condition is. In mild cases, treatment may not be needed. The kidneys may heal on their own. In more severe cases, treatment will involve:  Treating the cause of the kidney injury. This may involve changing any medicines you are taking or adjusting your dosage.  Fluids. You may need specialized IV fluids to balance your body's needs.  Having a catheter placed to drain urine and prevent blockages.  Preventing problems from occurring. This may mean avoiding certain medicines or procedures that can cause further injury to the kidneys. In some cases treatment may also require:  A procedure to remove toxic wastes from the body (dialysis or continuous renal replacement therapy - CRRT).  Surgery. This may be done to repair a torn kidney, or to remove the blockage from the urinary system. Follow these instructions at home: Medicines   Take over-the-counter and prescription medicines only as told by your health care provider.  Do not take any new medicines without your health care provider's approval. Many medicines can worsen your kidney damage.  Do not take any vitamin and mineral supplements without your health care provider's approval. Many nutritional supplements can worsen your kidney damage. Lifestyle   If your health care provider prescribed changes to your diet, follow them. You may need to decrease the amount of protein you eat.  Achieve and maintain a   healthy weight. If you need help with this, ask your health care provider.  Start or continue an exercise plan. Try to exercise at least 30 minutes a day, 5 days a week.  Do not use any tobacco products, such as cigarettes, chewing tobacco, and e-cigarettes. If you need help quitting, ask  your health care provider. General instructions   Keep track of your blood pressure. Report changes in your blood pressure as told by your health care provider.  Stay up to date with immunizations. Ask your health care provider which immunizations you need.  Keep all follow-up visits as told by your health care provider. This is important. Where to find more information:  American Association of Kidney Patients: www.aakp.org  National Kidney Foundation: www.kidney.org  American Kidney Fund: www.akfinc.org  [employer] [facility]:  www.lifeoptions.org  www.kidneyschool.org Contact a health care provider if:  Your symptoms get worse.  You develop new symptoms. Get help right away if:  You develop symptoms of worsening kidney disease, which include:  Headaches.  Abnormally dark or light skin.  Easy bruising.  Frequent hiccups.  Chest pain.  Shortness of breath.  End of menstruation in women.  Seizures.  Confusion or altered mental status.  Abdominal or back pain.  Itchiness.  You have a fever.  Your body is producing less urine.  You have pain or bleeding when you urinate. Summary  Acute kidney injury is a sudden worsening of kidney function.  Acute kidney injury can be caused by problems with blood flow to the kidneys, direct damage to the kidneys, and sudden blockage of urine flow.  Symptoms of this condition may not be obvious until it becomes severe. Symptoms may include edema, lethargy, confusion, nausea or vomiting, and problems passing urine.  This condition can usually be diagnosed with blood tests, urine tests, and imaging tests. Sometimes a kidney biopsy is done to diagnose this condition.  Treatment for this condition often involves treating the underlying cause. It is treated with fluids, medicines, dialysis, diet changes, or surgery. This information is not intended to replace advice given to you by your health care provider.  Make sure you discuss any questions you have with your health care provider. Document Released: 01/08/2011 Document Revised: 06/15/2016 Document Reviewed: 06/15/2016 Elsevier Interactive Patient Education  2017 [employer].  

## 2017-03-31 NOTE — Discharge Summary (Signed)
Physician Discharge Summary  Ivan Guerrero ZOX:[id number] DOB: 03-Dec-1955 DOA: 03/29/2017  PCP: Doris Cheadle, MD  Admit date: 03/29/2017 Discharge date: 03/31/2017  Time spent: 35 minutes  Recommendations for Outpatient Follow-up:  1. Repeat BMET to follow electrolytes and renal function  2. Reassess BP and adjust antihypertensive regimen as needed    Discharge Diagnoses:  Principal Problem:   Acute renal failure (ARF) (HCC) Active Problems:   Hypertension   Chronic back pain   Acute abdominal pain   Hypotension   Metabolic acidosis   Discharge Condition: stable and improved. Patient discharge home with instructions to follow up with PCP in 10 days.  Diet recommendation: heart healthy diet   Filed Weights   03/29/17 1957 03/30/17 0145 03/30/17 2140  Weight: 79.4 kg (175 lb) 79.5 kg (175 lb 4.3 oz) 79 kg (174 lb 2.6 oz)    History of present illness:  Brief Narrative:  61 y.o.malewith medical history significant for hypertension, chronic low back pain, and seasonal allergies, presenting to the emergency department for evaluation of acute abdominal pain associated with loose stools, poor urine output despite drinking fluids.   In ER, pt had SBP in 70's and was started on IVF. Blood work notable for Cr 7.14 and [facility] asked to admit for further evaluation.   Hospital Course:  Acute renal failure  - suspected pre-renal etiology in setting of Lisinopril - HCTZ use and dehydration. -renal US w/o obstruction -excellent response to IVF's and avoidance of nephrotoxic agents -discharge off lisinopril, HCTZ and asked not to use NSAID's until follow up with PCP -patient instructed to keep himself well hydrated.  Hypotension, with prior hx of HTN  -appears to be secondary to hypovolemia and continue use of antihypertensives - improved and stable at discharge (without need for antihypertensives for now) -advise to follow heart healthy diet and will need BP recheck at follow visit.    Abdominal pain  - Uncertain etiology and possibly related to viral gastroenteritis -now essentially resolved -advise to keep himself well hydrated  - renal US with benign appearing right renal cysts but I do not suspect this is contributing to his present illness  -tolerating diet at discharge  Chronic back pain  - Stable  - continue Gabapentin and instructed to avoid use of NSAID's for now -tylenol recommended  Procedures:  See below for x-ray reports   Consultations:  None   Discharge Exam: Vitals:   03/30/17 2140 03/31/17 0451  BP: (!) 116/54 (!) 113/56  Pulse: (!) 55 (!) 51  Resp: 17 (!) 21  Temp: 98 F (36.7 C) 98.1 F (36.7 C)  SpO2: 100% 100%   General exam: Appears calm and comfortable  Respiratory system: Clear to auscultation. Respiratory effort normal. Cardiovascular system: S1 & S2 heard, RRR. No JVD, rubs, gallops or clicks. No pedal edema. Gastrointestinal system: Abdomen is nondistended, soft and nontender. No organomegaly or masses felt. Normal bowel sounds heard. Central nervous system: Alert and oriented. No focal neurological deficits. Extremities: Symmetric 5 x 5 power. Skin: No rashes, lesions or ulcers Psychiatry: Judgement and insight appear normal. Mood & affect appropriate.   Discharge Instructions   Discharge Instructions    Diet - low sodium heart healthy    Complete by:  As directed    Discharge instructions    Complete by:  As directed    Take medications as prescribed  Please follow up with PCP in 10 days Avoid the use of NSAID's (ibuprofen, motrin, excedrin, aleve, advil, goody powder, etc....) Keep  yourself well hydrated Medications for BP has been discontinued until follow up with your primary care doctor Please follow heart healthy diet     Current Discharge Medication List    START taking these medications   Details  polyvinyl alcohol (LIQUIFILM TEARS) 1.4 % ophthalmic solution Place 1 drop into both eyes as needed  for dry eyes. Qty: 15 mL, Refills: 0      CONTINUE these medications which have NOT CHANGED   Details  fluticasone (FLONASE) 50 MCG/ACT nasal spray Place 2 sprays into both nostrils daily. Qty: 16 g, Refills: 3   Associated Diagnoses: Nasal congestion    gabapentin (NEURONTIN) 400 MG capsule Take 400 mg by mouth 3 (three) times daily. Refills: 1    montelukast (SINGULAIR) 10 MG tablet Take 10 mg by mouth at bedtime. Refills: 2    PROAIR HFA 108 (90 Base) MCG/ACT inhaler Inhale 2 Pump into the lungs 2 (two) times daily. Refills: 3      STOP taking these medications     amLODipine (NORVASC) 5 MG tablet      lisinopril-hydrochlorothiazide (PRINZIDE,ZESTORETIC) 20-12.5 MG tablet      sulindac (CLINORIL) 200 MG tablet        No Known Allergies    The results of significant diagnostics from this hospitalization (including imaging, microbiology, ancillary and laboratory) are listed below for reference.    Significant Diagnostic Studies: US Renal  Result Date: 03/29/2017 CLINICAL DATA:  Acute renal failure EXAM: RENAL / URINARY TRACT ULTRASOUND COMPLETE COMPARISON:  None. FINDINGS: Right Kidney: Length: 12.8 cm.  No hydronephrosis.  Two anechoic round cysts. Left Kidney: Length: 11.8 cm.  No hydronephrosis Bladder: Appears normal for degree of bladder distention. IMPRESSION: 1. No acute renal findings. 2. Benign-appearing RIGHT renal cysts. Electronically Signed   By: Genevive Bi M.D.   On: 03/29/2017 23:37   Dg Chest Port 1 View  Result Date: 03/29/2017 CLINICAL DATA:  Right shoulder pain hypotension and weakness EXAM: PORTABLE CHEST 1 VIEW COMPARISON:  02/10/2014 FINDINGS: Rotated patient. No acute consolidation or effusion. Cardiomediastinal silhouette within normal limits. No pneumothorax. IMPRESSION: No active disease. Electronically Signed   By: Jasmine Pang M.D.   On: 03/29/2017 20:34    Microbiology: No results found for this or any previous visit (from the past  240 hour(s)).   Labs: Basic Metabolic Panel:  Recent Labs Lab 03/29/17 1957 03/30/17 0239 03/31/17 0307  NA 136 137 138  K 4.3 4.1 3.5  CL 102 109 108  CO2 19* 19* 24  GLUCOSE 161* 109* 114*  BUN 66* 57* 32*  CREATININE 7.14* 4.82* 1.65*  CALCIUM 9.4 7.8* 8.0*   Liver Function Tests:  Recent Labs Lab 03/29/17 1957  AST 20  ALT 13*  ALKPHOS 43  BILITOT 0.8  PROT 8.1  ALBUMIN 4.4    Recent Labs Lab 03/29/17 1957  LIPASE 28   CBC:  Recent Labs Lab 03/29/17 1957 03/30/17 0239 03/31/17 0307  WBC 9.0 8.7 5.0  HGB 14.0 12.0* 10.5*  HCT 43.3 37.5* 32.3*  MCV 83.3 83.5 83.0  PLT 338 256 229   Cardiac Enzymes:  Recent Labs Lab 03/29/17 2016  CKTOTAL 521*   CBG:  Recent Labs Lab 03/30/17 0743 03/31/17 0727  GLUCAP 69 90       Signed:  Vassie Loll MD.  [facility] 03/31/2017, 1:49 PM

## 2017-03-31 NOTE — Progress Notes (Signed)
Patient discharged to home alone. IV removed. Telemetry removed. All discharge instructions reviewed. Patient left unit in stable condition.  Avelina Laine RN

## 2019-09-17 ENCOUNTER — Other Ambulatory Visit: Payer: Self-pay

## 2019-09-17 ENCOUNTER — Emergency Department (HOSPITAL_COMMUNITY): Payer: Medicaid Other

## 2019-09-17 ENCOUNTER — Emergency Department (HOSPITAL_COMMUNITY)
Admission: EM | Admit: 2019-09-17 | Discharge: 2019-09-17 | Disposition: A | Discharge status: Home / Self Care | Payer: Medicaid Other | Attending: Emergency Medicine | Admitting: Emergency Medicine

## 2019-09-17 ENCOUNTER — Encounter (HOSPITAL_COMMUNITY): Payer: Self-pay

## 2019-09-17 DIAGNOSIS — R1011 Right upper quadrant pain: Secondary | ICD-10-CM | POA: Insufficient documentation

## 2019-09-17 DIAGNOSIS — F17210 Nicotine dependence, cigarettes, uncomplicated: Secondary | ICD-10-CM | POA: Diagnosis not present

## 2019-09-17 DIAGNOSIS — K4021 Bilateral inguinal hernia, without obstruction or gangrene, recurrent: Secondary | ICD-10-CM | POA: Diagnosis not present

## 2019-09-17 DIAGNOSIS — I10 Essential (primary) hypertension: Secondary | ICD-10-CM | POA: Insufficient documentation

## 2019-09-17 DIAGNOSIS — R112 Nausea with vomiting, unspecified: Secondary | ICD-10-CM | POA: Diagnosis not present

## 2019-09-17 DIAGNOSIS — Z79899 Other long term (current) drug therapy: Secondary | ICD-10-CM | POA: Diagnosis not present

## 2019-09-17 LAB — URINALYSIS, ROUTINE W REFLEX MICROSCOPIC
Bilirubin Urine: NEGATIVE
Glucose, UA: NEGATIVE mg/dL
Hgb urine dipstick: NEGATIVE
Ketones, ur: NEGATIVE mg/dL
Leukocytes,Ua: NEGATIVE
Nitrite: NEGATIVE
Protein, ur: NEGATIVE mg/dL
Specific Gravity, Urine: 1.02 (ref 1.005–1.030)
pH: 7 (ref 5.0–8.0)

## 2019-09-17 LAB — CBC
HCT: 45.9 % (ref 39.0–52.0)
Hemoglobin: 14.4 g/dL (ref 13.0–17.0)
MCH: 26.4 pg (ref 26.0–34.0)
MCHC: 31.4 g/dL (ref 30.0–36.0)
MCV: 84.1 fL (ref 80.0–100.0)
Platelets: 238 10*3/uL (ref 150–400)
RBC: 5.46 MIL/uL (ref 4.22–5.81)
RDW: 15 % (ref 11.5–15.5)
WBC: 4 10*3/uL (ref 4.0–10.5)
nRBC: 0 % (ref 0.0–0.2)

## 2019-09-17 LAB — COMPREHENSIVE METABOLIC PANEL
ALT: 9 U/L (ref 0–44)
AST: 16 U/L (ref 15–41)
Albumin: 3.4 g/dL — ABNORMAL LOW (ref 3.5–5.0)
Alkaline Phosphatase: 38 U/L (ref 38–126)
Anion gap: 10 (ref 5–15)
BUN: 17 mg/dL (ref 8–23)
CO2: 27 mmol/L (ref 22–32)
Calcium: 8.8 mg/dL — ABNORMAL LOW (ref 8.9–10.3)
Chloride: 97 mmol/L — ABNORMAL LOW (ref 98–111)
Creatinine, Ser: 1.58 mg/dL — ABNORMAL HIGH (ref 0.61–1.24)
GFR calc Af Amer: 54 mL/min — ABNORMAL LOW (ref >60)
GFR calc non Af Amer: 46 mL/min — ABNORMAL LOW (ref >60)
Glucose, Bld: 128 mg/dL — ABNORMAL HIGH (ref 70–99)
Potassium: 4.3 mmol/L (ref 3.5–5.1)
Sodium: 134 mmol/L — ABNORMAL LOW (ref 135–145)
Total Bilirubin: 0.6 mg/dL (ref 0.3–1.2)
Total Protein: 7.2 g/dL (ref 6.5–8.1)

## 2019-09-17 LAB — TROPONIN I (HIGH SENSITIVITY): Troponin I (High Sensitivity): 6 ng/L (ref <18)

## 2019-09-17 LAB — LIPASE, BLOOD: Lipase: 28 U/L (ref 11–51)

## 2019-09-17 MED ORDER — IOHEXOL 300 MG/ML  SOLN
100.0000 mL | Freq: Once | INTRAMUSCULAR | Status: AC | PRN
  Administered 2019-09-17: 100 mL via INTRAVENOUS

## 2019-09-17 MED ORDER — ONDANSETRON 4 MG PO TBDP: ORAL_TABLET | ORAL | 0 refills | Status: AC

## 2019-09-17 MED ORDER — ONDANSETRON HCL 4 MG/2ML IJ SOLN
4.0000 mg | Freq: Once | INTRAMUSCULAR | Status: AC
  Administered 2019-09-17: 4 mg via INTRAVENOUS
  Filled 2019-09-17: qty 2

## 2019-09-17 MED ORDER — SODIUM CHLORIDE 0.9 % IV BOLUS
1000.0000 mL | Freq: Once | INTRAVENOUS | Status: AC
  Administered 2019-09-17: 09:00:00 1000 mL via INTRAVENOUS

## 2019-09-17 MED ORDER — MORPHINE SULFATE (PF) 4 MG/ML IV SOLN
4.0000 mg | Freq: Once | INTRAVENOUS | Status: AC
  Administered 2019-09-17: 09:00:00 4 mg via INTRAVENOUS
  Filled 2019-09-17: qty 1

## 2019-09-17 NOTE — ED Notes (Signed)
Transported to [place].

## 2019-09-17 NOTE — ED Provider Notes (Signed)
[facility] EMERGENCY DEPARTMENT Provider Note   CSN: [id number] Arrival date & time: 09/17/19  7989     History Chief Complaint  Patient presents with   Abdominal Pain    Ivan Guerrero is a 64 y.o. male hx of HTN, gallstones, here with abdominal pain.  Patient has been having abdominal pain for about a week or so.  Mostly epigastric area and worse with eating.  Patient states that he is unable to eat due to pain for the last 2 days.  Also has some vomiting as well .  Patient states that he had previous gallstones and had a stent placed previously.  States that he never had a cholecystectomy denies any fevers or chills.  Denies any Covid exposure.  The history is provided by the patient.       Past Medical History:  Diagnosis Date   Chronic back pain    Gallstones    Hypertension     Patient Active Problem List   Diagnosis Date Noted   Metabolic acidosis    Hypertension 03/29/2017   Acute renal failure (ARF) (HCC) 03/29/2017   Chronic back pain 03/29/2017   Acute abdominal pain 03/29/2017   Hypotension 03/29/2017   Smoking 02/19/2014   Elevated BP 02/19/2014   Nasal congestion 02/19/2014    Past Surgical History:  Procedure Laterality Date   CHOLECYSTECTOMY         Family History  Problem Relation Age of Onset   Hypertension Mother    Diabetes Mother    Hypertension Father    Cancer Father 94   Hypertension Sister    Diabetes Brother     Social History   Tobacco Use   Smoking status: Current Every Day Smoker    Packs/day: 1.00    Years: 35.00    Pack years: 35.00    Types: Cigarettes   Smokeless tobacco: Never Used   Tobacco comment: Smoking 1 ppd  Substance Use Topics   Alcohol use: Yes    Alcohol/week: 0.0 standard drinks    Comment: occ   Drug use: No    Home Medications Prior to Admission medications   Medication Sig Start Date End Date Taking? Authorizing Provider  fluticasone (FLONASE) 50  MCG/ACT nasal spray Place 2 sprays into both nostrils daily. 02/19/14  Yes Advani, Ayesha Rumpf, MD  gabapentin (NEURONTIN) 600 MG tablet Take 600 mg by mouth 3 (three) times daily. 08/19/19  Yes [provider]  lisinopril-hydrochlorothiazide (ZESTORETIC) 20-12.5 MG tablet Take 1 tablet by mouth daily. 08/20/19  Yes [provider]  montelukast (SINGULAIR) 10 MG tablet Take 10 mg by mouth at bedtime. 03/13/17  Yes [provider]  oxyCODONE-acetaminophen (PERCOCET) 7.5-325 MG tablet Take 1 tablet by mouth every 6 (six) hours as needed for pain. 08/25/19  Yes [provider]  polyvinyl alcohol (LIQUIFILM TEARS) 1.4 % ophthalmic solution Place 1 drop into both eyes as needed for dry eyes. 03/31/17  Yes Vassie Loll, MD  PROAIR HFA 108 [phone] Base) MCG/ACT inhaler Inhale 2 puffs into the lungs every 6 (six) hours as needed for wheezing or shortness of breath.  02/05/17  Yes [provider]  sertraline (ZOLOFT) 25 MG tablet Take 25 mg by mouth daily. 08/19/19  Yes [provider]  sodium chloride (OCEAN) 0.65 % SOLN nasal spray Place 1 spray into both nostrils as needed for congestion.   Yes [provider]  tiZANidine (ZANAFLEX) 4 MG tablet Take 4-8 mg by mouth See admin instructions. Taking  1 to 2 tabs once- three times daily as needed muscle spasms 07/11/19  Yes [provider]  buprenorphine (BUTRANS) 5 MCG/HR PTWK Place 1 patch onto the skin once a week. LF @ [facility] on 08-26-19 # 4 DS 28 08/26/19   [provider]    Allergies    Patient has no known allergies.  Review of Systems   Review of Systems  Gastrointestinal: Positive for abdominal pain.  All other systems reviewed and are negative.   Physical Exam Updated Vital Signs BP (!) 117/93 (BP Location: Right Arm)    Pulse 76    Temp 98.3 F (36.8 C) (Oral)    Resp 16    SpO2 100%   Physical Exam Vitals and nursing note reviewed.  Constitutional:      Appearance: He is  well-developed.  HENT:     Head: Normocephalic.     Mouth/Throat:     Comments: MM slightly dry  Eyes:     Extraocular Movements: Extraocular movements intact.  Cardiovascular:     Rate and Rhythm: Normal rate and regular rhythm.  Pulmonary:     Effort: Pulmonary effort is normal.     Breath sounds: Normal breath sounds.  Abdominal:     General: Abdomen is flat.     Comments: + epigastric tenderness, ? Murphy's. Also periumbilical tenderness   Skin:    General: Skin is warm.     Capillary Refill: Capillary refill takes less than 2 seconds.  Neurological:     General: No focal deficit present.     Mental Status: He is alert and oriented to person, place, and time.  Psychiatric:        Mood and Affect: Mood normal.        Behavior: Behavior normal.     ED Results / Procedures / Treatments   Labs (all labs ordered are listed, but only abnormal results are displayed) Labs Reviewed  COMPREHENSIVE METABOLIC PANEL - Abnormal; Notable for the following components:      Result Value   Sodium 134 (*)    Chloride 97 (*)    Glucose, Bld 128 (*)    Creatinine, Ser 1.58 (*)    Calcium 8.8 (*)    Albumin 3.4 (*)    GFR calc non Af Amer 46 (*)    GFR calc Af Amer 54 (*)    All other components within normal limits  LIPASE, BLOOD  CBC  URINALYSIS, ROUTINE W REFLEX MICROSCOPIC  TROPONIN I (HIGH SENSITIVITY)  TROPONIN I (HIGH SENSITIVITY)    EKG EKG Interpretation  Date/Time:  Thursday September 17 2019 09:16:43 EST Ventricular Rate:  65 PR Interval:    QRS Duration: 108 QT Interval:  355 QTC Calculation: 369 R Axis:   75 Text Interpretation: Sinus rhythm Borderline short PR interval RSR' in V1 or V2, probably normal variant No significant change since last tracing Confirmed by Wandra Arthurs [phone]) on 09/17/2019 9:22:45 AM Also confirmed by Wandra Arthurs [phone]), editor Hattie Perch [phone])  on 09/17/2019 10:52:38 AM   Radiology CT ABDOMEN PELVIS W CONTRAST  Result Date:  09/17/2019 CLINICAL DATA:  Abdominal distension. EXAM: CT ABDOMEN AND PELVIS WITH CONTRAST TECHNIQUE: Multidetector CT imaging of the abdomen and pelvis was performed using the standard protocol following bolus administration of intravenous contrast. CONTRAST:  113mL OMNIPAQUE IOHEXOL 300 MG/ML  SOLN COMPARISON:  Abdominal sonogram from 09/17/2019 FINDINGS: Lower chest: No acute abnormality. Hepatobiliary: No focal liver abnormality identified. There is mild increase caliber  of the CBD status post cholecystectomy which measures up to 7 mm, image 51/6. Pancreas: Unremarkable. No pancreatic ductal dilatation or surrounding inflammatory changes. Spleen: Normal in size without focal abnormality. Adrenals/Urinary Tract: Normal appearance of the adrenal glands. Right kidney cysts are noted. No mass or hydronephrosis identified bilaterally. The urinary bladder is unremarkable. Stomach/Bowel: Stomach is nondistended. No bowel wall thickening, inflammation or distension identified. The appendix is visualized and appears normal. No pathologic dilatation of the colon. Vascular/Lymphatic: Aortic atherosclerosis. No aneurysm. No abdominopelvic adenopathy identified. Reproductive: Prostate is unremarkable. Other: There is a large right inguinal hernia containing fat and a nonobstructed loop of distal small bowel. Moderate size fat containing left inguinal hernia is also present. There is no ascites or focal fluid collections identified. Musculoskeletal: Degenerative disc disease identified within the lumbar spine. Most advanced at L4-5. Bilateral hip osteoarthritis. IMPRESSION: 1. No acute findings identified within the abdomen or pelvis. 2. Large right inguinal hernia containing fat and a nonobstructed loop of distal small bowel. 3. Moderate size fat containing left inguinal hernia. Aortic Atherosclerosis (ICD10-I70.0). Electronically Signed   By: Signa Kell M.D.   On: 09/17/2019 11:05   US Abdomen Limited RUQ  Result  Date: 09/17/2019 CLINICAL DATA:  Right upper quadrant pain, history of cholecystectomy EXAM: ULTRASOUND ABDOMEN LIMITED RIGHT UPPER QUADRANT COMPARISON:  Renal sonogram of 03/29/2017 FINDINGS: Gallbladder: Post cholecystectomy.  Dilation of common bile duct to 7.5 mm. Common bile duct: Diameter: 7.5 Liver: No focal lesion identified. Within normal limits in parenchymal echogenicity. Portal vein is patent on color Doppler imaging with normal direction of blood flow towards the liver. Other: 2 x 1.6 x 1.6 cm cyst in the right kidney unchanged from previous exam IMPRESSION: 1. Mild dilation of common bile duct following cholecystectomy, likely post cholecystectomy related. Correlate with laboratory values. Electronically Signed   By: Donzetta Kohut M.D.   On: 09/17/2019 10:01    Procedures Procedures (including critical care time)  Medications Ordered in ED Medications  sodium chloride 0.9 % bolus 1,000 mL (0 mLs Intravenous Stopped 09/17/19 1020)  ondansetron (ZOFRAN) injection 4 mg (4 mg Intravenous Given 09/17/19 0913)  morphine 4 MG/ML injection 4 mg (4 mg Intravenous Given 09/17/19 0913)  iohexol (OMNIPAQUE) 300 MG/ML solution 100 mL (100 mLs Intravenous Contrast Given 09/17/19 1044)    ED Course  I have reviewed the triage vital signs and the nursing notes.  Pertinent labs & imaging results that were available during my care of the patient were reviewed by me and considered in my medical decision making (see chart for details).    MDM Rules/Calculators/A&P                      Ivan Guerrero is a 64 y.o. male here with abdominal pain. Consider biliary colic vs appendicitis vs gastroenteritis. Will get labs, RUQ [place], CT ab/pel.   12:02 PM Labs unremarkable. Cr stable. CT showed bilateral nonobstructing hernias. [place] RUQ showed dilated CBD as expected but no signs of cholecystitis. On exam, he has bilateral inguinal hernias that are easily reducible. Will refer to surgery outpatient    Final  Clinical Impression(s) / ED Diagnoses Final diagnoses:  RUQ pain    Rx / DC Orders ED Discharge Orders    None       Charlynne Pander, MD 09/17/19 1204

## 2019-09-17 NOTE — Discharge Instructions (Signed)
Take zofran for nausea   You have bilateral hernias. Please follow up with surgery   Return to ER if you have worse abdominal pain, vomiting, hernia site become hard and painful

## 2019-09-17 NOTE — ED Notes (Signed)
Patient given a urinal to provide urine sample when able to go.

## 2019-09-17 NOTE — ED Triage Notes (Signed)
Pt c.o generalized abd pain for the past week, denies n/v/d. Hx of gallstones.

## 2019-11-23 ENCOUNTER — Encounter: Payer: Medicaid Other | Attending: Internal Medicine | Admitting: Registered"

## 2020-11-30 IMAGING — CT CT ABD-PELV W/ CM
2 of 5 series · 16 of 46 positions shown, 18 images · IV contrast (Omni 300)
Comparison: Abdominal sonogram from 09/17/2019

CLINICAL DATA: Abdominal distension.

EXAM:
CT ABDOMEN AND PELVIS WITH CONTRAST
TECHNIQUE: Multidetector CT imaging of the abdomen and pelvis was performed
using the standard protocol following bolus administration of
intravenous contrast.
CONTRAST:  100mL OMNIPAQUE IOHEXOL 300 MG/ML  SOLN

[Series 3: a/p w/ 5mm · axial · 0.74mm/px · z∈[+697,+1172]mm · 13 of 107 slices shown, 15 images]
[im 6/107  soft-tissue]
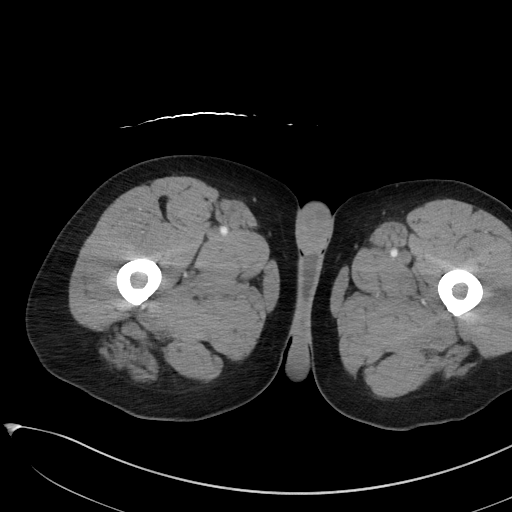
[im 6/107  bone]
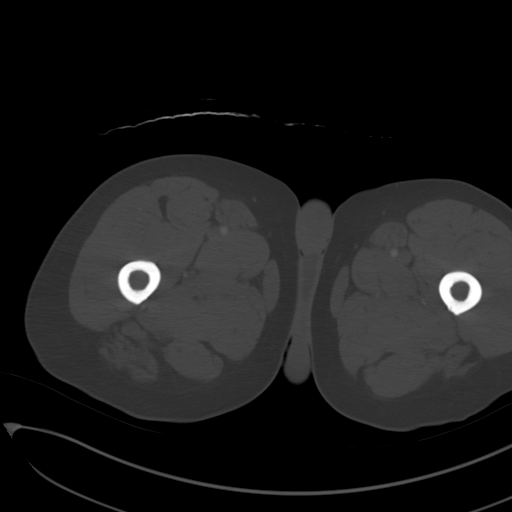
[im 12/107  soft-tissue]
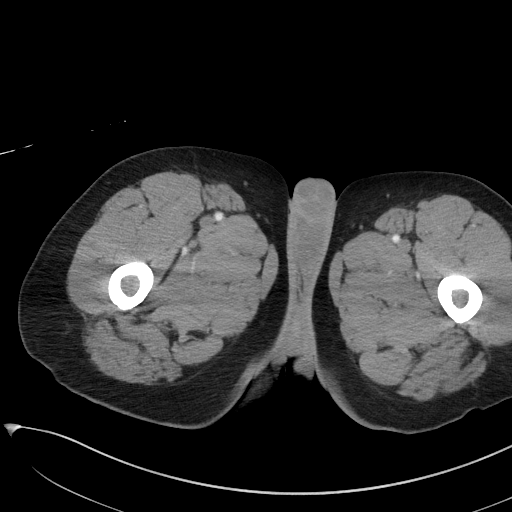
[im 24/107  soft-tissue]
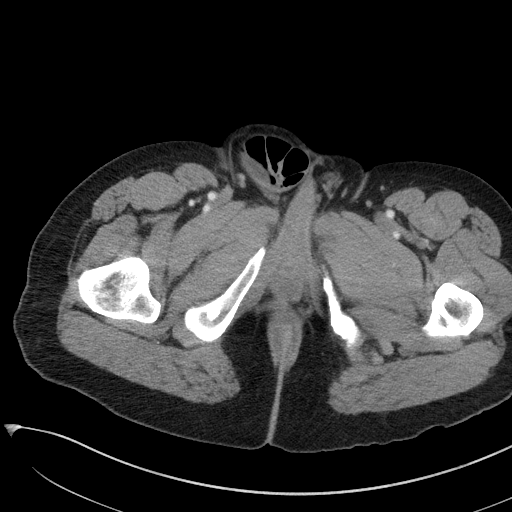
[im 30/107  soft-tissue]
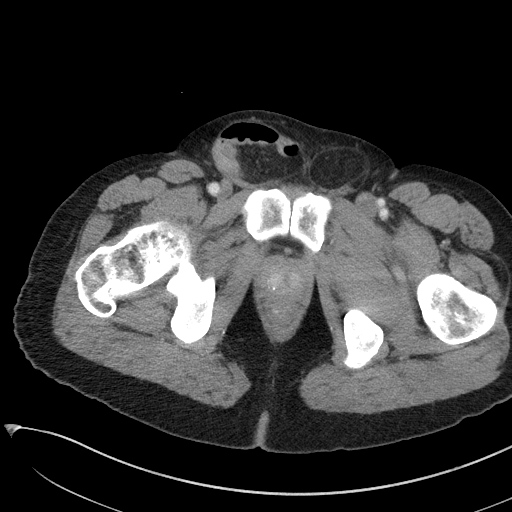
[im 36/107  soft-tissue]
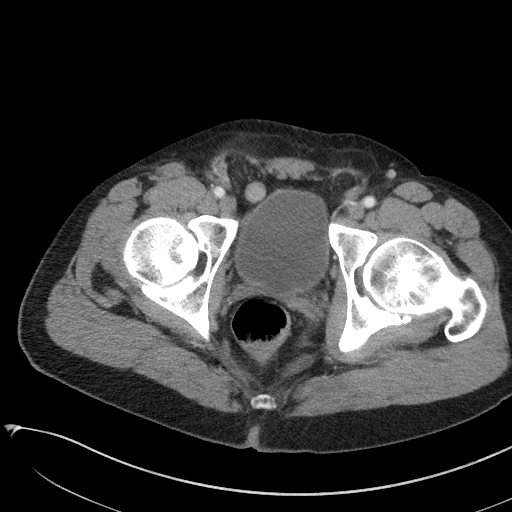
[im 48/107  soft-tissue]
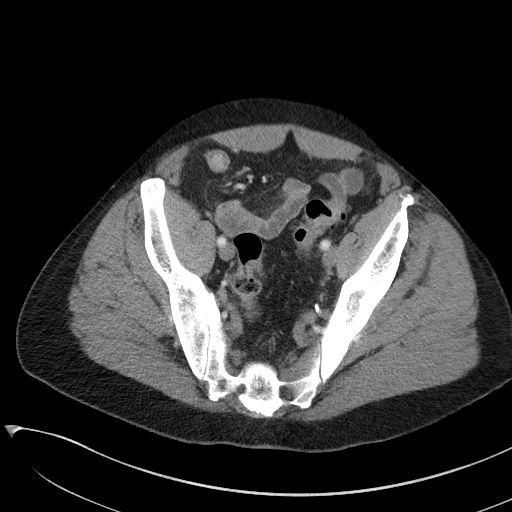
[im 54/107  soft-tissue]
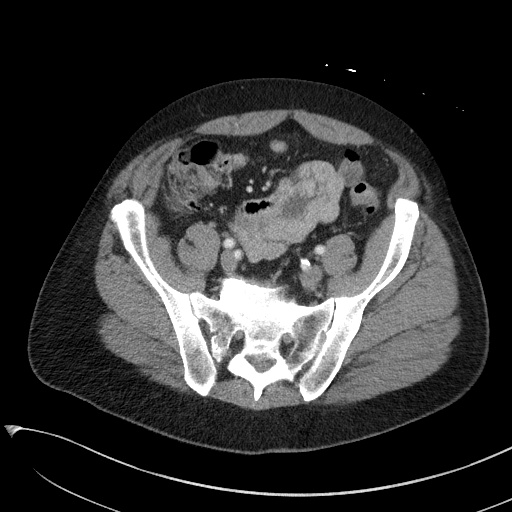
[im 59/107  soft-tissue]
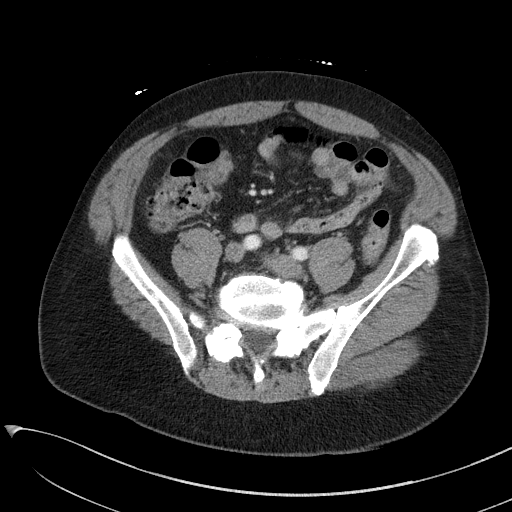
[im 71/107  soft-tissue]
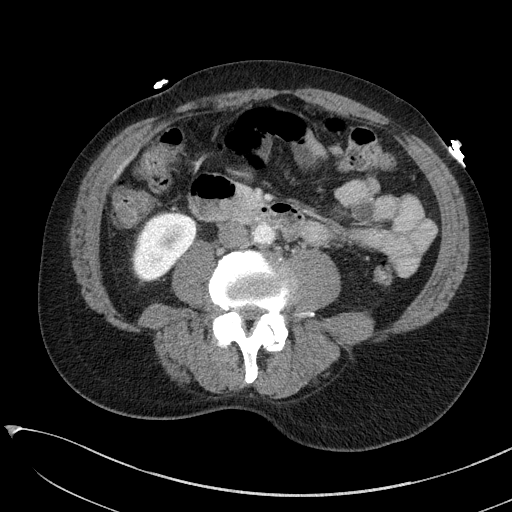
[im 71/107  bone]
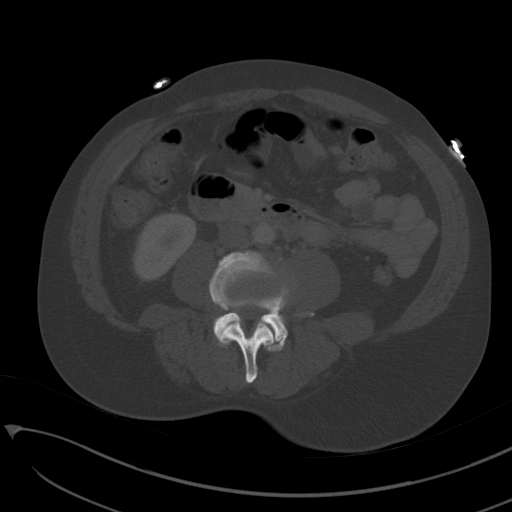
[im 77/107  soft-tissue]
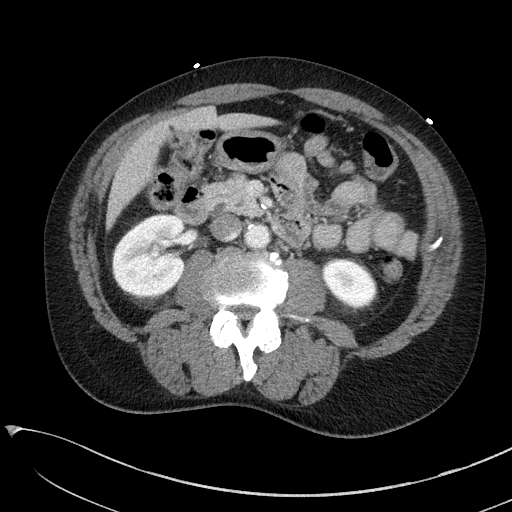
[im 83/107  soft-tissue]
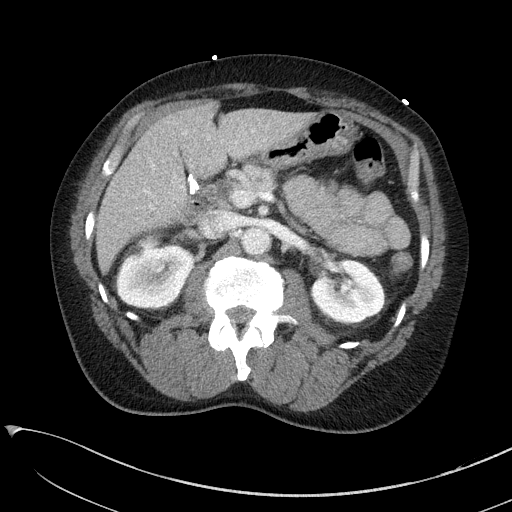
[im 95/107  soft-tissue]
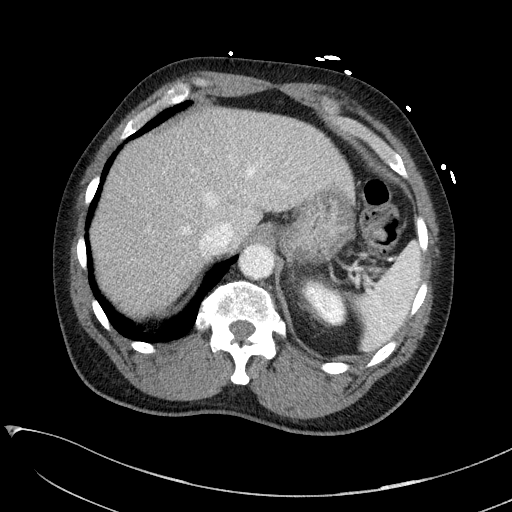
[im 101/107  soft-tissue]
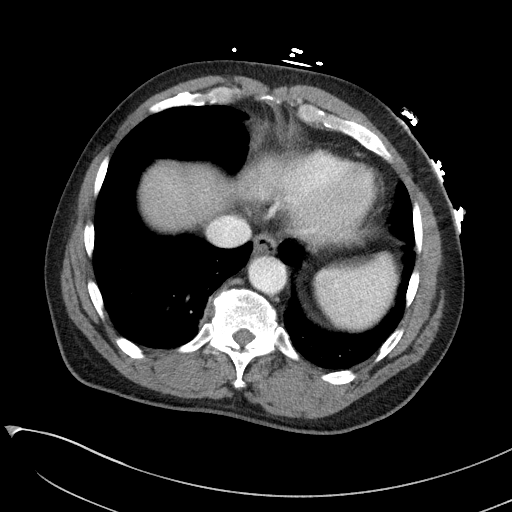

[Series 6: a/p w/ cor · coronal · 0.89mm/px · 3 of 150 slices shown]
[im 50/150  soft-tissue]
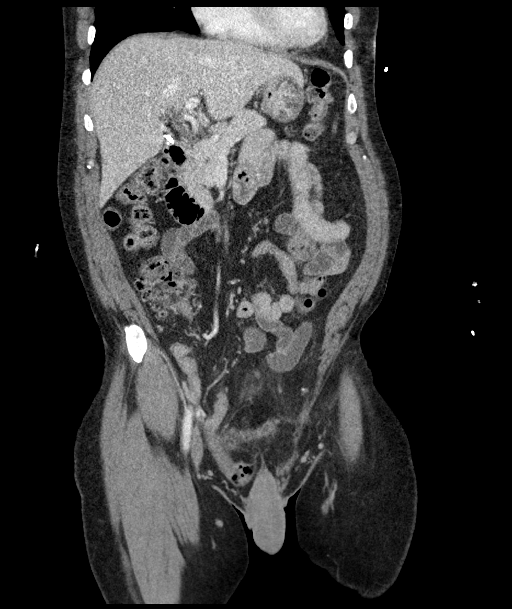
[im 67/150  soft-tissue]
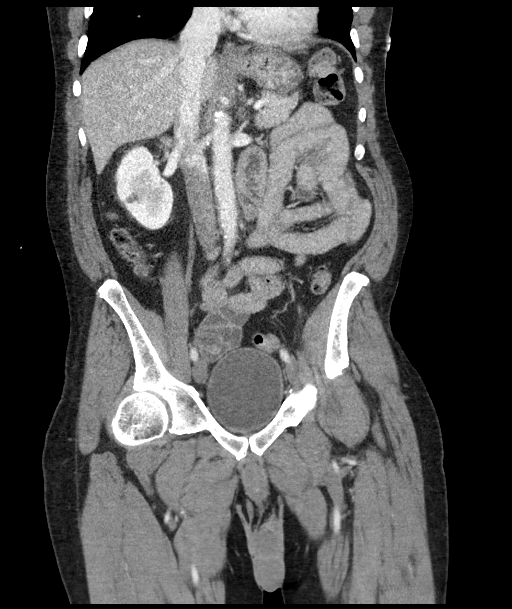
[im 83/150  soft-tissue]
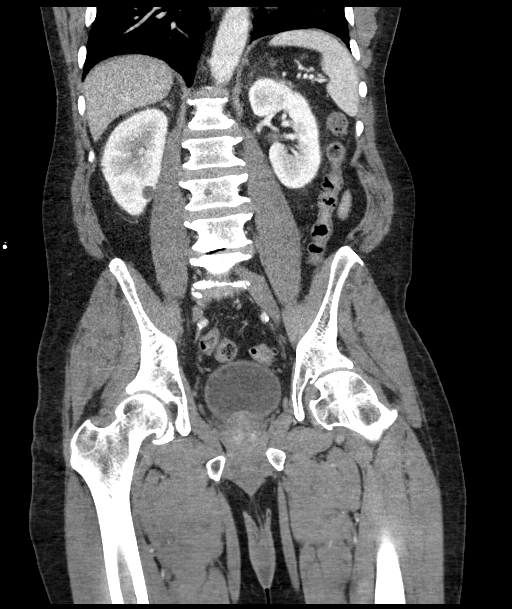

[16 of 46 positions shown; findings below may reference images not displayed]

FINDINGS: Lower chest: No acute abnormality.

Hepatobiliary: No focal liver abnormality identified. There is mild
increase caliber of the CBD status post cholecystectomy which
measures up to 7 mm, image 51/6.

Pancreas: Unremarkable. No pancreatic ductal dilatation or
surrounding inflammatory changes.

Spleen: Normal in size without focal abnormality.

Adrenals/Urinary Tract: Normal appearance of the adrenal glands.
Right kidney cysts are noted. No mass or hydronephrosis identified
bilaterally. The urinary bladder is unremarkable.

Stomach/Bowel: Stomach is nondistended. No bowel wall thickening,
inflammation or distension identified. The appendix is visualized
and appears normal. No pathologic dilatation of the colon.

Vascular/Lymphatic: Aortic atherosclerosis. No aneurysm. No
abdominopelvic adenopathy identified.

Reproductive: Prostate is unremarkable.

Other: There is a large right inguinal hernia containing fat and a
nonobstructed loop of distal small bowel. Moderate size fat
containing left inguinal hernia is also present.

There is no ascites or focal fluid collections identified.

Musculoskeletal: Degenerative disc disease identified within the
lumbar spine. Most advanced at L4-5. Bilateral hip osteoarthritis.
IMPRESSION: 1. No acute findings identified within the abdomen or pelvis.
2. Large right inguinal hernia containing fat and a nonobstructed
loop of distal small bowel.
3. Moderate size fat containing left inguinal hernia.

Aortic Atherosclerosis ([id number]).

## 2022-03-25 ENCOUNTER — Other Ambulatory Visit: Payer: Self-pay

## 2022-03-25 DIAGNOSIS — R2689 Other abnormalities of gait and mobility: Secondary | ICD-10-CM | POA: Insufficient documentation

## 2022-03-25 DIAGNOSIS — I10 Essential (primary) hypertension: Secondary | ICD-10-CM | POA: Insufficient documentation

## 2022-03-25 DIAGNOSIS — Z20822 Contact with and (suspected) exposure to covid-19: Secondary | ICD-10-CM | POA: Insufficient documentation

## 2022-03-25 DIAGNOSIS — Y900 Blood alcohol level of less than 20 mg/100 ml: Secondary | ICD-10-CM | POA: Insufficient documentation

## 2022-03-25 DIAGNOSIS — R197 Diarrhea, unspecified: Secondary | ICD-10-CM | POA: Insufficient documentation

## 2022-03-25 DIAGNOSIS — R001 Bradycardia, unspecified: Secondary | ICD-10-CM | POA: Diagnosis not present

## 2022-03-25 DIAGNOSIS — Z79899 Other long term (current) drug therapy: Secondary | ICD-10-CM | POA: Insufficient documentation

## 2022-03-25 DIAGNOSIS — R0981 Nasal congestion: Secondary | ICD-10-CM | POA: Diagnosis not present

## 2022-03-25 DIAGNOSIS — R42 Dizziness and giddiness: Secondary | ICD-10-CM | POA: Diagnosis present

## 2022-03-25 DIAGNOSIS — E871 Hypo-osmolality and hyponatremia: Secondary | ICD-10-CM | POA: Diagnosis not present

## 2022-03-25 DIAGNOSIS — F14120 Cocaine abuse with intoxication, uncomplicated: Secondary | ICD-10-CM | POA: Diagnosis not present

## 2022-03-26 ENCOUNTER — Emergency Department (HOSPITAL_COMMUNITY): Payer: Medicaid Other

## 2022-03-26 ENCOUNTER — Encounter (HOSPITAL_COMMUNITY): Payer: Self-pay

## 2022-03-26 ENCOUNTER — Emergency Department (HOSPITAL_COMMUNITY)
Admission: EM | Admit: 2022-03-26 | Discharge: 2022-03-26 | Disposition: A | Discharge status: Home / Self Care | Payer: Medicaid Other | Attending: Emergency Medicine | Admitting: Emergency Medicine

## 2022-03-26 ENCOUNTER — Other Ambulatory Visit: Payer: Self-pay

## 2022-03-26 DIAGNOSIS — F14120 Cocaine abuse with intoxication, uncomplicated: Secondary | ICD-10-CM

## 2022-03-26 DIAGNOSIS — R42 Dizziness and giddiness: Secondary | ICD-10-CM

## 2022-03-26 LAB — URINALYSIS, ROUTINE W REFLEX MICROSCOPIC
Bacteria, UA: NONE SEEN
Bilirubin Urine: NEGATIVE
Glucose, UA: NEGATIVE mg/dL
Ketones, ur: NEGATIVE mg/dL
Nitrite: NEGATIVE
Protein, ur: NEGATIVE mg/dL
Specific Gravity, Urine: 1.011 (ref 1.005–1.030)
pH: 5 (ref 5.0–8.0)

## 2022-03-26 LAB — HEPATIC FUNCTION PANEL
ALT: 9 U/L (ref 0–44)
ALT: 9 U/L (ref 0–44)
AST: 19 U/L (ref 15–41)
AST: 20 U/L (ref 15–41)
Albumin: 3.7 g/dL (ref 3.5–5.0)
Albumin: 3.5 g/dL (ref 3.5–5.0)
Alkaline Phosphatase: 38 U/L (ref 38–126)
Alkaline Phosphatase: 40 U/L (ref 38–126)
Bilirubin, Direct: <0.1 mg/dL (ref 0.0–0.2)
Bilirubin, Direct: 0.2 mg/dL (ref 0.0–0.2)
Indirect Bilirubin: 0.8 mg/dL (ref 0.3–0.9)
Total Bilirubin: 0.5 mg/dL (ref 0.3–1.2)
Total Bilirubin: 1 mg/dL (ref 0.3–1.2)
Total Protein: 6.6 g/dL (ref 6.5–8.1)
Total Protein: 6.6 g/dL (ref 6.5–8.1)

## 2022-03-26 LAB — BASIC METABOLIC PANEL
Anion gap: 11 (ref 5–15)
BUN: 28 mg/dL — ABNORMAL HIGH (ref 8–23)
CO2: 23 mmol/L (ref 22–32)
Calcium: 8.9 mg/dL (ref 8.9–10.3)
Chloride: 100 mmol/L (ref 98–111)
Creatinine, Ser: 1.32 mg/dL — ABNORMAL HIGH (ref 0.61–1.24)
GFR, Estimated: >60 mL/min (ref >60)
Glucose, Bld: 122 mg/dL — ABNORMAL HIGH (ref 70–99)
Potassium: 3.5 mmol/L (ref 3.5–5.1)
Sodium: 134 mmol/L — ABNORMAL LOW (ref 135–145)

## 2022-03-26 LAB — RESP PANEL BY RT-PCR (FLU A&B, COVID) ARPGX2
Influenza A by PCR: NEGATIVE
Influenza B by PCR: NEGATIVE
SARS Coronavirus 2 by RT PCR: NEGATIVE

## 2022-03-26 LAB — LACTIC ACID, PLASMA: Lactic Acid, Venous: 1.5 mmol/L (ref 0.5–1.9)

## 2022-03-26 LAB — DIFFERENTIAL
Abs Immature Granulocytes: 0.02 10*3/uL (ref 0.00–0.07)
Basophils Absolute: 0 10*3/uL (ref 0.0–0.1)
Basophils Relative: 1 %
Eosinophils Absolute: 0 10*3/uL (ref 0.0–0.5)
Eosinophils Relative: 1 %
Immature Granulocytes: 0 %
Lymphocytes Relative: 26 %
Lymphs Abs: 1.2 10*3/uL (ref 0.7–4.0)
Monocytes Absolute: 0.6 10*3/uL (ref 0.1–1.0)
Monocytes Relative: 13 %
Neutro Abs: 2.7 10*3/uL (ref 1.7–7.7)
Neutrophils Relative %: 59 %

## 2022-03-26 LAB — CBC
HCT: 38.4 % — ABNORMAL LOW (ref 39.0–52.0)
Hemoglobin: 12.7 g/dL — ABNORMAL LOW (ref 13.0–17.0)
MCH: 27.5 pg (ref 26.0–34.0)
MCHC: 33.1 g/dL (ref 30.0–36.0)
MCV: 83.1 fL (ref 80.0–100.0)
Platelets: 242 10*3/uL (ref 150–400)
RBC: 4.62 MIL/uL (ref 4.22–5.81)
RDW: 15.8 % — ABNORMAL HIGH (ref 11.5–15.5)
WBC: 4.6 10*3/uL (ref 4.0–10.5)
nRBC: 0 % (ref 0.0–0.2)

## 2022-03-26 LAB — RAPID URINE DRUG SCREEN, HOSP PERFORMED
Amphetamines: NOT DETECTED
Barbiturates: NOT DETECTED
Benzodiazepines: NOT DETECTED
Cocaine: POSITIVE — AB
Opiates: NOT DETECTED
Tetrahydrocannabinol: NOT DETECTED

## 2022-03-26 LAB — ETHANOL: Alcohol, Ethyl (B): <10 mg/dL (ref <10)

## 2022-03-26 LAB — CBG MONITORING, ED: Glucose-Capillary: 124 mg/dL — ABNORMAL HIGH (ref 70–99)

## 2022-03-26 LAB — TROPONIN I (HIGH SENSITIVITY)
Troponin I (High Sensitivity): 10 ng/L (ref <18)
Troponin I (High Sensitivity): 12 ng/L (ref <18)

## 2022-03-26 MED ORDER — LACTATED RINGERS IV BOLUS
1000.0000 mL | Freq: Once | INTRAVENOUS | Status: AC
  Administered 2022-03-26: 1000 mL via INTRAVENOUS

## 2022-03-26 NOTE — ED Notes (Signed)
Patient reports feet are no longer numb, and his head doesn't hurt, it's just "woozy."

## 2022-03-26 NOTE — ED Notes (Signed)
Patient transported to CT 

## 2022-03-26 NOTE — ED Notes (Signed)
ED Provider at bedside. 

## 2022-03-26 NOTE — ED Provider Notes (Signed)
[facility] EMERGENCY DEPARTMENT Provider Note   CSN: [id number] Arrival date & time: 03/25/22  2359     History  Chief Complaint  Patient presents with   Dizziness    Ivan Guerrero is a 66 y.o. male who presents with concern for congestion, cough for several days, generally feeling fatigued with sudden headache that started around 4 hours prior to arrival with associated lightheadedness without vision changes nausea vomiting or confusion.  Endorses diarrhea yesterday and chills, myalgias.  Patient denies known sick contacts.  I personally reviewed his medical records previous history of hypertension, chronic back pain and gallstones.  He is not anticoagulated.  HPI     Home Medications Prior to Admission medications   Medication Sig Start Date End Date Taking? Authorizing Provider  buprenorphine (BUTRANS) 5 MCG/HR PTWK Place 1 patch onto the skin once a week. LF @ [facility] on 08-26-19 # 4 DS 28 08/26/19   [provider]  fluticasone (FLONASE) 50 MCG/ACT nasal spray Place 2 sprays into both nostrils daily. 02/19/14   Doris Cheadle, MD  gabapentin (NEURONTIN) 600 MG tablet Take 600 mg by mouth 3 (three) times daily. 08/19/19   [provider]  lisinopril-hydrochlorothiazide (ZESTORETIC) 20-12.5 MG tablet Take 1 tablet by mouth daily. 08/20/19   [provider]  montelukast (SINGULAIR) 10 MG tablet Take 10 mg by mouth at bedtime. 03/13/17   [provider]  ondansetron (ZOFRAN ODT) 4 MG disintegrating tablet 4mg  ODT q4 hours prn nausea/vomit 09/17/19   11/17/19, MD  oxyCODONE-acetaminophen (PERCOCET) 7.5-325 MG tablet Take 1 tablet by mouth every 6 (six) hours as needed for pain. 08/25/19   [provider]  polyvinyl alcohol (LIQUIFILM TEARS) 1.4 % ophthalmic solution Place 1 drop into both eyes as needed for dry eyes. 03/31/17   04/02/17, MD  PROAIR HFA 108 [phone] Base) MCG/ACT inhaler Inhale 2 puffs into the lungs every 6  (six) hours as needed for wheezing or shortness of breath.  02/05/17   [provider]  sertraline (ZOLOFT) 25 MG tablet Take 25 mg by mouth daily. 08/19/19   [provider]  sodium chloride (OCEAN) 0.65 % SOLN nasal spray Place 1 spray into both nostrils as needed for congestion.    [provider]  tiZANidine (ZANAFLEX) 4 MG tablet Take 4-8 mg by mouth See admin instructions. Taking 1 to 2 tabs once- three times daily as needed muscle spasms 07/11/19   [provider]      Allergies    Patient has no known allergies.    Review of Systems   Review of Systems  Constitutional:  Positive for appetite change, chills and fatigue.  HENT:  Positive for congestion.   Eyes:  Positive for discharge. Negative for photophobia, pain and visual disturbance.  Respiratory:  Positive for cough.   Gastrointestinal:  Positive for diarrhea.  Genitourinary: Negative.   Musculoskeletal: Negative.   Skin: Negative.   Neurological:  Positive for light-headedness. Negative for dizziness.    Physical Exam Updated Vital Signs BP 125/77   Pulse (!) 48   Temp 97.6 F (36.4 C)   Resp 20   Ht 5\' 8"  (1.727 m)   Wt 79 kg   SpO2 100%   BMI 26.48 kg/m  Physical Exam Vitals and nursing note reviewed.  Constitutional:      Appearance: He is ill-appearing. He is not toxic-appearing.  HENT:     Head: Normocephalic and atraumatic.     Nose: Congestion  present.     Mouth/Throat:     Mouth: Mucous membranes are moist.     Pharynx: No oropharyngeal exudate or posterior oropharyngeal erythema.  Eyes:     General:        Right eye: No discharge.        Left eye: No discharge.     Extraocular Movements: Extraocular movements intact.     Conjunctiva/sclera: Conjunctivae normal.     Pupils: Pupils are equal, round, and reactive to light.  Cardiovascular:     Rate and Rhythm: Normal rate and regular rhythm.     Pulses: Normal pulses.     Heart sounds: Normal heart sounds. No  murmur heard. Pulmonary:     Effort: Pulmonary effort is normal. No respiratory distress.     Breath sounds: Normal breath sounds. No wheezing or rales.  Abdominal:     General: Bowel sounds are normal. There is no distension.     Palpations: Abdomen is soft.     Tenderness: There is no abdominal tenderness. There is no right CVA tenderness, left CVA tenderness, guarding or rebound.  Musculoskeletal:        General: No deformity.     Cervical back: Neck supple.     Right lower leg: No edema.     Left lower leg: No edema.  Skin:    General: Skin is warm and dry.     Capillary Refill: Capillary refill takes less than 2 seconds.  Neurological:     General: No focal deficit present.     Mental Status: He is alert and oriented to person, place, and time. Mental status is at baseline.     GCS: GCS eye subscore is 3. GCS verbal subscore is 4. GCS motor subscore is 6.     Cranial Nerves: Cranial nerves 2-12 are intact.     Sensory: Sensation is intact.     Motor: Motor function is intact. No pronator drift.     Coordination: Coordination normal. Finger-Nose-Finger Test and Heel to Shin Test normal. Rapid alternating movements normal.     Comments: Romberg and gait deferred due to patient weakness  Psychiatric:        Mood and Affect: Mood normal.     ED Results / Procedures / Treatments   Labs (all labs ordered are listed, but only abnormal results are displayed) Labs Reviewed  BASIC METABOLIC PANEL - Abnormal; Notable for the following components:      Result Value   Sodium 134 (*)    Glucose, Bld 122 (*)    BUN 28 (*)    Creatinine, Ser 1.32 (*)    All other components within normal limits  CBC - Abnormal; Notable for the following components:   Hemoglobin 12.7 (*)    HCT 38.4 (*)    RDW 15.8 (*)    All other components within normal limits  URINALYSIS, ROUTINE W REFLEX MICROSCOPIC - Abnormal; Notable for the following components:   Hgb urine dipstick SMALL (*)     Leukocytes,Ua TRACE (*)    All other components within normal limits  RAPID URINE DRUG SCREEN, HOSP PERFORMED - Abnormal; Notable for the following components:   Cocaine POSITIVE (*)    All other components within normal limits  CBG MONITORING, ED - Abnormal; Notable for the following components:   Glucose-Capillary 124 (*)    All other components within normal limits  RESP PANEL BY RT-PCR (FLU A&B, COVID) ARPGX2  LACTIC ACID, PLASMA  ETHANOL  DIFFERENTIAL  HEPATIC FUNCTION PANEL  HEPATIC FUNCTION PANEL  TROPONIN I (HIGH SENSITIVITY)  TROPONIN I (HIGH SENSITIVITY)    EKG EKG Interpretation  Date/Time:  Monday March 26 2022 03:15:32 EDT Ventricular Rate:  47 PR Interval:  122 QRS Duration: 87 QT Interval:  411 QTC Calculation: 364 R Axis:   76 Text Interpretation: Sinus bradycardia Confirmed by Drema Pry [phone]) on 03/26/2022 3:34:50 AM  Radiology CT HEAD WO CONTRAST ( )  Result Date: 03/26/2022 CLINICAL DATA:  Dizziness EXAM: CT HEAD WITHOUT CONTRAST TECHNIQUE: Contiguous axial images were obtained from the base of the skull through the vertex without intravenous contrast. RADIATION DOSE REDUCTION: This exam was performed according to the departmental dose-optimization program which includes automated exposure control, adjustment of the mA and/or kV according to patient size and/or use of iterative reconstruction technique. COMPARISON:  03/24/2010 FINDINGS: Brain: No evidence of acute infarction, hemorrhage, hydrocephalus, extra-axial collection or mass lesion/mass effect. Vascular: No hyperdense vessel or unexpected calcification. Skull: Normal. Negative for fracture or focal lesion. Sinuses/Orbits: No acute finding. Other: None. IMPRESSION: No acute intracranial abnormality noted. Electronically Signed   By: Alcide Clever M.D.   On: 03/26/2022 01:27   DG Chest Port 1 View  Result Date: 03/26/2022 CLINICAL DATA:  Cough, altered mental status, chest pain, dizziness.  EXAM: PORTABLE CHEST 1 VIEW COMPARISON:  03/29/2017. FINDINGS: The heart size and mediastinal contours are within normal limits. Subsegmental atelectasis is present at the left lung base. No consolidation, effusion, or pneumothorax. No acute osseous abnormality. IMPRESSION: No active disease. Electronically Signed   By: Thornell Sartorius M.D.   On: 03/26/2022 01:19    Procedures Procedures    Medications Ordered in ED Medications  lactated ringers bolus 1,000 mL (0 mLs Intravenous Stopped 03/26/22 0303)    ED Course/ Medical Decision Making/ A&P Clinical Course as of 03/26/22 6269  [facility] Mar 26, 2022  4854 Patient reevaluated, continues to feel generally weak and still somewhat altered in his mental status.  UDS positive for cocaine, when questioned about this patient states he did do cocaine this evening immediately prior to onset of his symptoms.  States that he typically takes OxyContin at home for his chronic back pain but was at a friend's house did not have it with him for his evening dose therefore did cocaine as an alternative.  Neurologic exam remains nonfocal at this time.  Incidental note of bradycardia on the monitor during evaluation, repeat EKG reviewed by EDP. Hx of sinus bradycardia in the past, dating back to 2014 per chart review.  [RS]    Clinical Course User Index [RS] Muhamed Luecke, Eugene Gavia, PA-C                           Medical Decision Making 66 year old male who presents with concern for lightheadedness and generalized weakness that started this evening.  Vital signs are normal and intake.  Cardiopulmonary exam is normal, abdominal exam is benign.  Patient is generally ill-appearing but has nonfocal neurologic exam.  The differential diagnosis of weakness includes but is not limited to: Neurologic causes: GBS, myasthenia gravis, CVA, MS, ALS, transverse myelitis, spinal cord injury, CVA, botulism Other causes: ACS, Arrhythmia, syncope, orthostatic hypotension, sepsis,  hypoglycemia, electrolyte disturbance, hypothyroidism, respiratory failure, symptomatic anemia, dehydration, heat injury, polypharmacy, malignancy, intoxication.    Amount and/or Complexity of Data Reviewed Labs: ordered.    Details: CBC without leukocytosis and with mild anemia with hemoglobin of 12.7.  BMP with creatinine of  1.3 near patient's baseline, mild hyponatremia of 134, fluid bolus given.  UDS positive for cocaine, UA without evidence of infection.  Lactic acid is normal, hepatic panel, troponins, and RVP are all normal.  Ethanol negative. Radiology: ordered.    Details: CT head visualized by this provider negative for acute intracranial normality.  Chest x-ray negative for acute cardiopulmonary disease.  ECG/medicine tests:     Details: EKG with normal sinus rhythm, subsequently with bradycardia though this is not new for the patient.    See ED course above, patient did endorse subsequently using cocaine immediately prior to onset of his symptoms.  Given reassuring work-up, suspect etiology of his presentation is acute intoxication.  Will allow to metabolize. Patient reevaluated after several hours of metabolization and sleep in the emergency department and is now ambulatory in the ED, alert and oriented, and with normal vital signs.  No longer experiencing weakness.  Tolerating p.o.  No further work-up warranted in ED at this time.  Clinical concern for emergent underlying etiology would warrant further ED work-up or inpatient management is exceedingly low.  Alinda Moneyony voiced understanding of her medical evaluation and treatment plan. Each of their questions answered to their expressed satisfaction.  Return precautions were given.  Patient is well-appearing, stable, and was discharged in good condition.  This chart was dictated using voice recognition software, Dragon. Despite the best efforts of this provider to proofread and correct errors, errors may still occur which can change  documentation meaning.    Final Clinical Impression(s) / ED Diagnoses Final diagnoses:  Lightheadedness  Cocaine abuse with intoxication and without complication [facility](HCC)    Rx / DC Orders ED Discharge Orders     None         Sherrilee GillesSponseller, Shaylinn Hladik R, PA-C 03/26/22 [id number]    Nira Connardama, Pedro Eduardo, MD 03/26/22 [phone]

## 2022-03-26 NOTE — ED Triage Notes (Signed)
Pt reports dizziness that started a couple of hours ago along with BL foot numbness. Pt neuro check intact. Pt also c/o generalized weakness and headache that happened all of a sudden. LSW 2130.

## 2022-03-26 NOTE — Discharge Instructions (Signed)
You were seen in the ER today for your lightheadedness and your weakness.  Suspect this was related to the cocaine you used immediately prior to onset of your symptoms.  Please refrain from using recreational drugs particularly cocaine as this can be dangerous for you.  Return to the ER with any severe symptoms.

## 2022-07-18 DIAGNOSIS — Z720 Tobacco use: Secondary | ICD-10-CM | POA: Diagnosis not present

## 2022-07-18 DIAGNOSIS — E1165 Type 2 diabetes mellitus with hyperglycemia: Secondary | ICD-10-CM | POA: Diagnosis not present

## 2022-07-18 DIAGNOSIS — J449 Chronic obstructive pulmonary disease, unspecified: Secondary | ICD-10-CM | POA: Diagnosis not present

## 2022-07-18 DIAGNOSIS — Z0001 Encounter for general adult medical examination with abnormal findings: Secondary | ICD-10-CM | POA: Diagnosis not present

## 2022-07-18 DIAGNOSIS — M1612 Unilateral primary osteoarthritis, left hip: Secondary | ICD-10-CM | POA: Diagnosis not present

## 2022-07-18 DIAGNOSIS — J301 Allergic rhinitis due to pollen: Secondary | ICD-10-CM | POA: Diagnosis not present

## 2022-07-18 DIAGNOSIS — I10 Essential (primary) hypertension: Secondary | ICD-10-CM | POA: Diagnosis not present

## 2022-07-18 DIAGNOSIS — M25552 Pain in left hip: Secondary | ICD-10-CM | POA: Diagnosis not present

## 2022-09-04 DIAGNOSIS — M79672 Pain in left foot: Secondary | ICD-10-CM | POA: Diagnosis not present

## 2022-09-04 DIAGNOSIS — G894 Chronic pain syndrome: Secondary | ICD-10-CM | POA: Diagnosis not present

## 2022-09-04 DIAGNOSIS — M545 Low back pain, unspecified: Secondary | ICD-10-CM | POA: Diagnosis not present

## 2022-09-04 DIAGNOSIS — G890 Central pain syndrome: Secondary | ICD-10-CM | POA: Diagnosis not present

## 2022-09-04 DIAGNOSIS — M25562 Pain in left knee: Secondary | ICD-10-CM | POA: Diagnosis not present

## 2022-09-04 DIAGNOSIS — Z79891 Long term (current) use of opiate analgesic: Secondary | ICD-10-CM | POA: Diagnosis not present

## 2022-10-17 DIAGNOSIS — M1612 Unilateral primary osteoarthritis, left hip: Secondary | ICD-10-CM | POA: Diagnosis not present

## 2022-10-17 DIAGNOSIS — E1165 Type 2 diabetes mellitus with hyperglycemia: Secondary | ICD-10-CM | POA: Diagnosis not present

## 2022-10-17 DIAGNOSIS — J301 Allergic rhinitis due to pollen: Secondary | ICD-10-CM | POA: Diagnosis not present

## 2022-10-17 DIAGNOSIS — J449 Chronic obstructive pulmonary disease, unspecified: Secondary | ICD-10-CM | POA: Diagnosis not present

## 2022-10-17 DIAGNOSIS — I10 Essential (primary) hypertension: Secondary | ICD-10-CM | POA: Diagnosis not present

## 2022-10-17 DIAGNOSIS — Z720 Tobacco use: Secondary | ICD-10-CM | POA: Diagnosis not present

## 2022-12-06 DIAGNOSIS — M545 Low back pain, unspecified: Secondary | ICD-10-CM | POA: Diagnosis not present

## 2022-12-12 DIAGNOSIS — J449 Chronic obstructive pulmonary disease, unspecified: Secondary | ICD-10-CM | POA: Diagnosis not present

## 2022-12-12 DIAGNOSIS — J301 Allergic rhinitis due to pollen: Secondary | ICD-10-CM | POA: Diagnosis not present

## 2022-12-12 DIAGNOSIS — E1165 Type 2 diabetes mellitus with hyperglycemia: Secondary | ICD-10-CM | POA: Diagnosis not present

## 2022-12-12 DIAGNOSIS — I10 Essential (primary) hypertension: Secondary | ICD-10-CM | POA: Diagnosis not present

## 2022-12-12 DIAGNOSIS — M1612 Unilateral primary osteoarthritis, left hip: Secondary | ICD-10-CM | POA: Diagnosis not present

## 2022-12-12 DIAGNOSIS — Z720 Tobacco use: Secondary | ICD-10-CM | POA: Diagnosis not present

## 2022-12-27 DIAGNOSIS — I10 Essential (primary) hypertension: Secondary | ICD-10-CM | POA: Diagnosis not present

## 2022-12-27 DIAGNOSIS — K4020 Bilateral inguinal hernia, without obstruction or gangrene, not specified as recurrent: Secondary | ICD-10-CM | POA: Diagnosis not present

## 2022-12-27 DIAGNOSIS — R7303 Prediabetes: Secondary | ICD-10-CM | POA: Diagnosis not present

## 2022-12-27 DIAGNOSIS — Z720 Tobacco use: Secondary | ICD-10-CM | POA: Diagnosis not present

## 2022-12-31 DIAGNOSIS — D539 Nutritional anemia, unspecified: Secondary | ICD-10-CM | POA: Diagnosis not present

## 2022-12-31 DIAGNOSIS — E7800 Pure hypercholesterolemia, unspecified: Secondary | ICD-10-CM | POA: Diagnosis not present

## 2022-12-31 DIAGNOSIS — E559 Vitamin D deficiency, unspecified: Secondary | ICD-10-CM | POA: Diagnosis not present

## 2022-12-31 DIAGNOSIS — Z131 Encounter for screening for diabetes mellitus: Secondary | ICD-10-CM | POA: Diagnosis not present

## 2022-12-31 DIAGNOSIS — M545 Low back pain, unspecified: Secondary | ICD-10-CM | POA: Diagnosis not present

## 2022-12-31 DIAGNOSIS — M129 Arthropathy, unspecified: Secondary | ICD-10-CM | POA: Diagnosis not present

## 2022-12-31 DIAGNOSIS — Z1159 Encounter for screening for other viral diseases: Secondary | ICD-10-CM | POA: Diagnosis not present

## 2022-12-31 DIAGNOSIS — R5383 Other fatigue: Secondary | ICD-10-CM | POA: Diagnosis not present

## 2022-12-31 DIAGNOSIS — F17210 Nicotine dependence, cigarettes, uncomplicated: Secondary | ICD-10-CM | POA: Diagnosis not present

## 2022-12-31 DIAGNOSIS — Z79891 Long term (current) use of opiate analgesic: Secondary | ICD-10-CM | POA: Diagnosis not present

## 2022-12-31 DIAGNOSIS — M25551 Pain in right hip: Secondary | ICD-10-CM | POA: Diagnosis not present

## 2023-01-03 DIAGNOSIS — Z79899 Other long term (current) drug therapy: Secondary | ICD-10-CM | POA: Diagnosis not present

## 2023-01-08 DIAGNOSIS — E559 Vitamin D deficiency, unspecified: Secondary | ICD-10-CM | POA: Diagnosis not present

## 2023-01-08 DIAGNOSIS — G8929 Other chronic pain: Secondary | ICD-10-CM | POA: Diagnosis not present

## 2023-01-08 DIAGNOSIS — E119 Type 2 diabetes mellitus without complications: Secondary | ICD-10-CM | POA: Diagnosis not present

## 2023-01-08 DIAGNOSIS — R030 Elevated blood-pressure reading, without diagnosis of hypertension: Secondary | ICD-10-CM | POA: Diagnosis not present

## 2023-01-08 DIAGNOSIS — M5440 Lumbago with sciatica, unspecified side: Secondary | ICD-10-CM | POA: Diagnosis not present

## 2023-01-08 DIAGNOSIS — Z79891 Long term (current) use of opiate analgesic: Secondary | ICD-10-CM | POA: Diagnosis not present

## 2023-01-11 DIAGNOSIS — Z79899 Other long term (current) drug therapy: Secondary | ICD-10-CM | POA: Diagnosis not present

## 2023-01-18 DIAGNOSIS — Z79891 Long term (current) use of opiate analgesic: Secondary | ICD-10-CM | POA: Diagnosis not present

## 2023-01-18 DIAGNOSIS — G8929 Other chronic pain: Secondary | ICD-10-CM | POA: Diagnosis not present

## 2023-01-18 DIAGNOSIS — E119 Type 2 diabetes mellitus without complications: Secondary | ICD-10-CM | POA: Diagnosis not present

## 2023-01-18 DIAGNOSIS — M5440 Lumbago with sciatica, unspecified side: Secondary | ICD-10-CM | POA: Diagnosis not present

## 2023-01-23 DIAGNOSIS — Z79899 Other long term (current) drug therapy: Secondary | ICD-10-CM | POA: Diagnosis not present

## 2023-01-25 DIAGNOSIS — K4000 Bilateral inguinal hernia, with obstruction, without gangrene, not specified as recurrent: Secondary | ICD-10-CM | POA: Diagnosis not present

## 2023-01-25 DIAGNOSIS — K4090 Unilateral inguinal hernia, without obstruction or gangrene, not specified as recurrent: Secondary | ICD-10-CM | POA: Diagnosis not present

## 2023-01-25 DIAGNOSIS — K4030 Unilateral inguinal hernia, with obstruction, without gangrene, not specified as recurrent: Secondary | ICD-10-CM | POA: Diagnosis not present

## 2023-01-25 DIAGNOSIS — G8918 Other acute postprocedural pain: Secondary | ICD-10-CM | POA: Diagnosis not present

## 2023-02-01 DIAGNOSIS — R030 Elevated blood-pressure reading, without diagnosis of hypertension: Secondary | ICD-10-CM | POA: Diagnosis not present

## 2023-02-01 DIAGNOSIS — Z79891 Long term (current) use of opiate analgesic: Secondary | ICD-10-CM | POA: Diagnosis not present

## 2023-02-01 DIAGNOSIS — E119 Type 2 diabetes mellitus without complications: Secondary | ICD-10-CM | POA: Diagnosis not present

## 2023-02-01 DIAGNOSIS — M5440 Lumbago with sciatica, unspecified side: Secondary | ICD-10-CM | POA: Diagnosis not present

## 2023-02-08 DIAGNOSIS — Z79891 Long term (current) use of opiate analgesic: Secondary | ICD-10-CM | POA: Diagnosis not present

## 2023-02-08 DIAGNOSIS — E559 Vitamin D deficiency, unspecified: Secondary | ICD-10-CM | POA: Diagnosis not present

## 2023-02-08 DIAGNOSIS — E119 Type 2 diabetes mellitus without complications: Secondary | ICD-10-CM | POA: Diagnosis not present

## 2023-02-08 DIAGNOSIS — G8929 Other chronic pain: Secondary | ICD-10-CM | POA: Diagnosis not present

## 2023-02-08 DIAGNOSIS — R030 Elevated blood-pressure reading, without diagnosis of hypertension: Secondary | ICD-10-CM | POA: Diagnosis not present

## 2023-02-08 DIAGNOSIS — F17210 Nicotine dependence, cigarettes, uncomplicated: Secondary | ICD-10-CM | POA: Diagnosis not present

## 2023-02-08 DIAGNOSIS — M5440 Lumbago with sciatica, unspecified side: Secondary | ICD-10-CM | POA: Diagnosis not present

## 2023-02-21 DIAGNOSIS — F17210 Nicotine dependence, cigarettes, uncomplicated: Secondary | ICD-10-CM | POA: Diagnosis not present

## 2023-02-21 DIAGNOSIS — G8929 Other chronic pain: Secondary | ICD-10-CM | POA: Diagnosis not present

## 2023-02-21 DIAGNOSIS — Z0130 Encounter for examination of blood pressure without abnormal findings: Secondary | ICD-10-CM | POA: Diagnosis not present

## 2023-02-21 DIAGNOSIS — E559 Vitamin D deficiency, unspecified: Secondary | ICD-10-CM | POA: Diagnosis not present

## 2023-02-21 DIAGNOSIS — E119 Type 2 diabetes mellitus without complications: Secondary | ICD-10-CM | POA: Diagnosis not present

## 2023-02-21 DIAGNOSIS — Z79891 Long term (current) use of opiate analgesic: Secondary | ICD-10-CM | POA: Diagnosis not present

## 2023-02-21 DIAGNOSIS — M5440 Lumbago with sciatica, unspecified side: Secondary | ICD-10-CM | POA: Diagnosis not present

## 2023-02-25 DIAGNOSIS — Z79899 Other long term (current) drug therapy: Secondary | ICD-10-CM | POA: Diagnosis not present

## 2023-03-07 DIAGNOSIS — F17210 Nicotine dependence, cigarettes, uncomplicated: Secondary | ICD-10-CM | POA: Diagnosis not present

## 2023-03-07 DIAGNOSIS — E559 Vitamin D deficiency, unspecified: Secondary | ICD-10-CM | POA: Diagnosis not present

## 2023-03-07 DIAGNOSIS — Z79891 Long term (current) use of opiate analgesic: Secondary | ICD-10-CM | POA: Diagnosis not present

## 2023-03-07 DIAGNOSIS — M5440 Lumbago with sciatica, unspecified side: Secondary | ICD-10-CM | POA: Diagnosis not present

## 2023-03-07 DIAGNOSIS — E119 Type 2 diabetes mellitus without complications: Secondary | ICD-10-CM | POA: Diagnosis not present

## 2023-03-07 DIAGNOSIS — I10 Essential (primary) hypertension: Secondary | ICD-10-CM | POA: Diagnosis not present

## 2023-03-07 DIAGNOSIS — G8929 Other chronic pain: Secondary | ICD-10-CM | POA: Diagnosis not present

## 2023-03-13 DIAGNOSIS — Z720 Tobacco use: Secondary | ICD-10-CM | POA: Diagnosis not present

## 2023-03-13 DIAGNOSIS — M1612 Unilateral primary osteoarthritis, left hip: Secondary | ICD-10-CM | POA: Diagnosis not present

## 2023-03-13 DIAGNOSIS — J449 Chronic obstructive pulmonary disease, unspecified: Secondary | ICD-10-CM | POA: Diagnosis not present

## 2023-03-13 DIAGNOSIS — I10 Essential (primary) hypertension: Secondary | ICD-10-CM | POA: Diagnosis not present

## 2023-03-13 DIAGNOSIS — J301 Allergic rhinitis due to pollen: Secondary | ICD-10-CM | POA: Diagnosis not present

## 2023-03-13 DIAGNOSIS — Z0001 Encounter for general adult medical examination with abnormal findings: Secondary | ICD-10-CM | POA: Diagnosis not present

## 2023-03-13 DIAGNOSIS — E1165 Type 2 diabetes mellitus with hyperglycemia: Secondary | ICD-10-CM | POA: Diagnosis not present

## 2023-03-27 DIAGNOSIS — J449 Chronic obstructive pulmonary disease, unspecified: Secondary | ICD-10-CM | POA: Diagnosis not present

## 2023-03-27 DIAGNOSIS — J301 Allergic rhinitis due to pollen: Secondary | ICD-10-CM | POA: Diagnosis not present

## 2023-03-27 DIAGNOSIS — M1612 Unilateral primary osteoarthritis, left hip: Secondary | ICD-10-CM | POA: Diagnosis not present

## 2023-03-27 DIAGNOSIS — Z720 Tobacco use: Secondary | ICD-10-CM | POA: Diagnosis not present

## 2023-03-27 DIAGNOSIS — E1165 Type 2 diabetes mellitus with hyperglycemia: Secondary | ICD-10-CM | POA: Diagnosis not present

## 2023-03-27 DIAGNOSIS — Z23 Encounter for immunization: Secondary | ICD-10-CM | POA: Diagnosis not present

## 2023-03-27 DIAGNOSIS — I10 Essential (primary) hypertension: Secondary | ICD-10-CM | POA: Diagnosis not present

## 2023-04-08 DIAGNOSIS — Z91148 Patient's other noncompliance with medication regimen for other reason: Secondary | ICD-10-CM | POA: Diagnosis not present

## 2023-04-08 DIAGNOSIS — E119 Type 2 diabetes mellitus without complications: Secondary | ICD-10-CM | POA: Diagnosis not present

## 2023-04-08 DIAGNOSIS — Z79891 Long term (current) use of opiate analgesic: Secondary | ICD-10-CM | POA: Diagnosis not present

## 2023-04-08 DIAGNOSIS — G8929 Other chronic pain: Secondary | ICD-10-CM | POA: Diagnosis not present

## 2023-04-08 DIAGNOSIS — E559 Vitamin D deficiency, unspecified: Secondary | ICD-10-CM | POA: Diagnosis not present

## 2023-04-08 DIAGNOSIS — F17210 Nicotine dependence, cigarettes, uncomplicated: Secondary | ICD-10-CM | POA: Diagnosis not present

## 2023-04-08 DIAGNOSIS — I10 Essential (primary) hypertension: Secondary | ICD-10-CM | POA: Diagnosis not present

## 2023-04-08 DIAGNOSIS — M5440 Lumbago with sciatica, unspecified side: Secondary | ICD-10-CM | POA: Diagnosis not present

## 2023-04-11 DIAGNOSIS — Z79899 Other long term (current) drug therapy: Secondary | ICD-10-CM | POA: Diagnosis not present

## 2023-06-12 DIAGNOSIS — Z9889 Other specified postprocedural states: Secondary | ICD-10-CM | POA: Diagnosis not present

## 2023-06-12 DIAGNOSIS — Z8719 Personal history of other diseases of the digestive system: Secondary | ICD-10-CM | POA: Diagnosis not present

## 2023-07-23 DIAGNOSIS — Z720 Tobacco use: Secondary | ICD-10-CM | POA: Diagnosis not present

## 2023-07-23 DIAGNOSIS — J301 Allergic rhinitis due to pollen: Secondary | ICD-10-CM | POA: Diagnosis not present

## 2023-07-23 DIAGNOSIS — I10 Essential (primary) hypertension: Secondary | ICD-10-CM | POA: Diagnosis not present

## 2023-07-23 DIAGNOSIS — M1612 Unilateral primary osteoarthritis, left hip: Secondary | ICD-10-CM | POA: Diagnosis not present

## 2023-07-23 DIAGNOSIS — Z0001 Encounter for general adult medical examination with abnormal findings: Secondary | ICD-10-CM | POA: Diagnosis not present

## 2023-07-23 DIAGNOSIS — J449 Chronic obstructive pulmonary disease, unspecified: Secondary | ICD-10-CM | POA: Diagnosis not present

## 2023-07-23 DIAGNOSIS — E1165 Type 2 diabetes mellitus with hyperglycemia: Secondary | ICD-10-CM | POA: Diagnosis not present

## 2023-10-16 DIAGNOSIS — E1165 Type 2 diabetes mellitus with hyperglycemia: Secondary | ICD-10-CM | POA: Diagnosis not present

## 2023-10-16 DIAGNOSIS — I10 Essential (primary) hypertension: Secondary | ICD-10-CM | POA: Diagnosis not present

## 2023-10-16 DIAGNOSIS — J449 Chronic obstructive pulmonary disease, unspecified: Secondary | ICD-10-CM | POA: Diagnosis not present

## 2023-10-16 DIAGNOSIS — J301 Allergic rhinitis due to pollen: Secondary | ICD-10-CM | POA: Diagnosis not present

## 2023-10-16 DIAGNOSIS — Z720 Tobacco use: Secondary | ICD-10-CM | POA: Diagnosis not present

## 2023-10-16 DIAGNOSIS — M1612 Unilateral primary osteoarthritis, left hip: Secondary | ICD-10-CM | POA: Diagnosis not present

## 2023-11-05 DIAGNOSIS — F17210 Nicotine dependence, cigarettes, uncomplicated: Secondary | ICD-10-CM | POA: Diagnosis not present

## 2023-11-05 DIAGNOSIS — G894 Chronic pain syndrome: Secondary | ICD-10-CM | POA: Diagnosis not present

## 2023-11-05 DIAGNOSIS — Z79891 Long term (current) use of opiate analgesic: Secondary | ICD-10-CM | POA: Diagnosis not present

## 2023-11-26 DIAGNOSIS — Z79891 Long term (current) use of opiate analgesic: Secondary | ICD-10-CM | POA: Diagnosis not present

## 2023-11-26 DIAGNOSIS — G894 Chronic pain syndrome: Secondary | ICD-10-CM | POA: Diagnosis not present

## 2023-11-26 DIAGNOSIS — F17210 Nicotine dependence, cigarettes, uncomplicated: Secondary | ICD-10-CM | POA: Diagnosis not present

## 2024-01-01 DIAGNOSIS — E1165 Type 2 diabetes mellitus with hyperglycemia: Secondary | ICD-10-CM | POA: Diagnosis not present

## 2024-01-01 DIAGNOSIS — J449 Chronic obstructive pulmonary disease, unspecified: Secondary | ICD-10-CM | POA: Diagnosis not present

## 2024-01-01 DIAGNOSIS — I10 Essential (primary) hypertension: Secondary | ICD-10-CM | POA: Diagnosis not present

## 2024-01-01 DIAGNOSIS — M1612 Unilateral primary osteoarthritis, left hip: Secondary | ICD-10-CM | POA: Diagnosis not present

## 2024-01-01 DIAGNOSIS — Z0001 Encounter for general adult medical examination with abnormal findings: Secondary | ICD-10-CM | POA: Diagnosis not present

## 2024-01-01 DIAGNOSIS — Z79891 Long term (current) use of opiate analgesic: Secondary | ICD-10-CM | POA: Diagnosis not present

## 2024-01-01 DIAGNOSIS — G894 Chronic pain syndrome: Secondary | ICD-10-CM | POA: Diagnosis not present

## 2024-01-01 DIAGNOSIS — Z720 Tobacco use: Secondary | ICD-10-CM | POA: Diagnosis not present

## 2024-01-01 DIAGNOSIS — J301 Allergic rhinitis due to pollen: Secondary | ICD-10-CM | POA: Diagnosis not present

## 2024-01-22 DIAGNOSIS — J449 Chronic obstructive pulmonary disease, unspecified: Secondary | ICD-10-CM | POA: Diagnosis not present

## 2024-01-22 DIAGNOSIS — M1612 Unilateral primary osteoarthritis, left hip: Secondary | ICD-10-CM | POA: Diagnosis not present

## 2024-01-22 DIAGNOSIS — Z720 Tobacco use: Secondary | ICD-10-CM | POA: Diagnosis not present

## 2024-01-22 DIAGNOSIS — R21 Rash and other nonspecific skin eruption: Secondary | ICD-10-CM | POA: Diagnosis not present

## 2024-01-22 DIAGNOSIS — I10 Essential (primary) hypertension: Secondary | ICD-10-CM | POA: Diagnosis not present

## 2024-01-22 DIAGNOSIS — E1165 Type 2 diabetes mellitus with hyperglycemia: Secondary | ICD-10-CM | POA: Diagnosis not present

## 2024-01-22 DIAGNOSIS — J301 Allergic rhinitis due to pollen: Secondary | ICD-10-CM | POA: Diagnosis not present

## 2024-01-28 DIAGNOSIS — G894 Chronic pain syndrome: Secondary | ICD-10-CM | POA: Diagnosis not present

## 2024-01-28 DIAGNOSIS — F17210 Nicotine dependence, cigarettes, uncomplicated: Secondary | ICD-10-CM | POA: Diagnosis not present

## 2024-01-28 DIAGNOSIS — Z79891 Long term (current) use of opiate analgesic: Secondary | ICD-10-CM | POA: Diagnosis not present

## 2024-02-25 DIAGNOSIS — M5106 Intervertebral disc disorders with myelopathy, lumbar region: Secondary | ICD-10-CM | POA: Diagnosis not present

## 2024-02-25 DIAGNOSIS — F17210 Nicotine dependence, cigarettes, uncomplicated: Secondary | ICD-10-CM | POA: Diagnosis not present

## 2024-02-25 DIAGNOSIS — G894 Chronic pain syndrome: Secondary | ICD-10-CM | POA: Diagnosis not present

## 2024-03-26 DIAGNOSIS — G894 Chronic pain syndrome: Secondary | ICD-10-CM | POA: Diagnosis not present

## 2024-03-26 DIAGNOSIS — F17210 Nicotine dependence, cigarettes, uncomplicated: Secondary | ICD-10-CM | POA: Diagnosis not present

## 2024-03-27 DIAGNOSIS — G894 Chronic pain syndrome: Secondary | ICD-10-CM | POA: Diagnosis not present

## 2024-03-27 DIAGNOSIS — Z79891 Long term (current) use of opiate analgesic: Secondary | ICD-10-CM | POA: Diagnosis not present

## 2024-04-22 DIAGNOSIS — G894 Chronic pain syndrome: Secondary | ICD-10-CM | POA: Diagnosis not present

## 2024-04-22 DIAGNOSIS — Z79891 Long term (current) use of opiate analgesic: Secondary | ICD-10-CM | POA: Diagnosis not present

## 2024-04-29 DIAGNOSIS — J301 Allergic rhinitis due to pollen: Secondary | ICD-10-CM | POA: Diagnosis not present

## 2024-04-29 DIAGNOSIS — Z720 Tobacco use: Secondary | ICD-10-CM | POA: Diagnosis not present

## 2024-04-29 DIAGNOSIS — Z23 Encounter for immunization: Secondary | ICD-10-CM | POA: Diagnosis not present

## 2024-04-29 DIAGNOSIS — E1165 Type 2 diabetes mellitus with hyperglycemia: Secondary | ICD-10-CM | POA: Diagnosis not present

## 2024-04-29 DIAGNOSIS — I10 Essential (primary) hypertension: Secondary | ICD-10-CM | POA: Diagnosis not present

## 2024-04-29 DIAGNOSIS — J449 Chronic obstructive pulmonary disease, unspecified: Secondary | ICD-10-CM | POA: Diagnosis not present

## 2024-04-29 DIAGNOSIS — M1612 Unilateral primary osteoarthritis, left hip: Secondary | ICD-10-CM | POA: Diagnosis not present
